# Patient Record
Sex: Female | Born: 1950 | Race: Black or African American | Hispanic: No | Marital: Married | Smoking: Never smoker
Health system: Southern US, Community
[De-identification: ages and names within clinical notes are randomized; demographics above are authoritative.]

## PROBLEM LIST (undated history)

## (undated) DIAGNOSIS — D563 Thalassemia minor: Secondary | ICD-10-CM

## (undated) DIAGNOSIS — I10 Essential (primary) hypertension: Secondary | ICD-10-CM

## (undated) HISTORY — PX: KNEE ARTHROSCOPY: SUR90

## (undated) HISTORY — PX: OOPHORECTOMY: SHX86

## (undated) HISTORY — DX: Thalassemia minor: D56.3

## (undated) HISTORY — DX: Essential (primary) hypertension: I10

---

## 1981-08-12 HISTORY — PX: GYNECOLOGIC CRYOSURGERY: SHX857

## 1988-08-12 HISTORY — PX: ABDOMINAL HYSTERECTOMY: SHX81

## 1997-10-25 ENCOUNTER — Ambulatory Visit (HOSPITAL_COMMUNITY): Admission: RE | Admit: 1997-10-25 | Discharge: 1997-10-25 | Payer: Self-pay | Admitting: Obstetrics and Gynecology

## 1997-12-01 ENCOUNTER — Other Ambulatory Visit: Admission: RE | Admit: 1997-12-01 | Discharge: 1997-12-01 | Payer: Self-pay | Admitting: Obstetrics and Gynecology

## 1998-12-29 ENCOUNTER — Ambulatory Visit (HOSPITAL_COMMUNITY): Admission: RE | Admit: 1998-12-29 | Discharge: 1998-12-29 | Payer: Self-pay | Admitting: Obstetrics and Gynecology

## 1998-12-29 ENCOUNTER — Encounter: Payer: Self-pay | Admitting: Obstetrics and Gynecology

## 2000-03-31 ENCOUNTER — Encounter: Payer: Self-pay | Admitting: Obstetrics and Gynecology

## 2000-03-31 ENCOUNTER — Ambulatory Visit (HOSPITAL_COMMUNITY): Admission: RE | Admit: 2000-03-31 | Discharge: 2000-03-31 | Payer: Self-pay | Admitting: Obstetrics and Gynecology

## 2001-10-13 ENCOUNTER — Encounter: Payer: Self-pay | Admitting: Obstetrics and Gynecology

## 2001-10-13 ENCOUNTER — Ambulatory Visit (HOSPITAL_COMMUNITY): Admission: RE | Admit: 2001-10-13 | Discharge: 2001-10-13 | Payer: Self-pay | Admitting: Obstetrics and Gynecology

## 2003-05-18 ENCOUNTER — Ambulatory Visit (HOSPITAL_COMMUNITY): Admission: RE | Admit: 2003-05-18 | Discharge: 2003-05-18 | Payer: Self-pay | Admitting: Obstetrics and Gynecology

## 2003-05-18 ENCOUNTER — Encounter: Payer: Self-pay | Admitting: Obstetrics and Gynecology

## 2004-08-29 ENCOUNTER — Ambulatory Visit (HOSPITAL_COMMUNITY): Admission: RE | Admit: 2004-08-29 | Discharge: 2004-08-29 | Payer: Self-pay | Admitting: Obstetrics and Gynecology

## 2004-12-17 ENCOUNTER — Encounter: Admission: RE | Admit: 2004-12-17 | Discharge: 2004-12-17 | Payer: Self-pay | Admitting: Orthopedic Surgery

## 2005-09-12 ENCOUNTER — Ambulatory Visit (HOSPITAL_BASED_OUTPATIENT_CLINIC_OR_DEPARTMENT_OTHER): Admission: RE | Admit: 2005-09-12 | Discharge: 2005-09-12 | Payer: Self-pay | Admitting: Orthopedic Surgery

## 2005-09-12 ENCOUNTER — Encounter (INDEPENDENT_AMBULATORY_CARE_PROVIDER_SITE_OTHER): Payer: Self-pay | Admitting: *Deleted

## 2005-10-24 ENCOUNTER — Ambulatory Visit (HOSPITAL_COMMUNITY): Admission: RE | Admit: 2005-10-24 | Discharge: 2005-10-24 | Payer: Self-pay | Admitting: Obstetrics and Gynecology

## 2005-10-29 ENCOUNTER — Other Ambulatory Visit: Admission: RE | Admit: 2005-10-29 | Discharge: 2005-10-29 | Payer: Self-pay | Admitting: Obstetrics and Gynecology

## 2006-11-14 ENCOUNTER — Ambulatory Visit (HOSPITAL_COMMUNITY): Admission: RE | Admit: 2006-11-14 | Discharge: 2006-11-14 | Payer: Self-pay | Admitting: Obstetrics and Gynecology

## 2006-11-18 ENCOUNTER — Encounter: Admission: RE | Admit: 2006-11-18 | Discharge: 2006-11-18 | Payer: Self-pay | Admitting: Internal Medicine

## 2006-11-24 ENCOUNTER — Encounter: Admission: RE | Admit: 2006-11-24 | Discharge: 2006-11-24 | Payer: Self-pay | Admitting: Internal Medicine

## 2007-01-07 ENCOUNTER — Other Ambulatory Visit: Admission: RE | Admit: 2007-01-07 | Discharge: 2007-01-07 | Payer: Self-pay | Admitting: Obstetrics and Gynecology

## 2007-07-22 ENCOUNTER — Encounter: Admission: RE | Admit: 2007-07-22 | Discharge: 2007-07-22 | Payer: Self-pay | Admitting: Internal Medicine

## 2007-12-03 ENCOUNTER — Ambulatory Visit (HOSPITAL_COMMUNITY): Admission: RE | Admit: 2007-12-03 | Discharge: 2007-12-03 | Payer: Self-pay | Admitting: Obstetrics and Gynecology

## 2008-01-11 ENCOUNTER — Other Ambulatory Visit: Admission: RE | Admit: 2008-01-11 | Discharge: 2008-01-11 | Payer: Self-pay | Admitting: Obstetrics and Gynecology

## 2008-09-05 ENCOUNTER — Ambulatory Visit: Payer: Self-pay | Admitting: Obstetrics and Gynecology

## 2008-09-08 ENCOUNTER — Ambulatory Visit (HOSPITAL_COMMUNITY): Admission: RE | Admit: 2008-09-08 | Discharge: 2008-09-08 | Payer: Self-pay | Admitting: Obstetrics and Gynecology

## 2009-01-06 ENCOUNTER — Ambulatory Visit (HOSPITAL_COMMUNITY): Admission: RE | Admit: 2009-01-06 | Discharge: 2009-01-06 | Payer: Self-pay | Admitting: Obstetrics and Gynecology

## 2009-02-21 ENCOUNTER — Other Ambulatory Visit: Admission: RE | Admit: 2009-02-21 | Discharge: 2009-02-21 | Payer: Self-pay | Admitting: Obstetrics and Gynecology

## 2009-02-21 ENCOUNTER — Encounter: Payer: Self-pay | Admitting: Obstetrics and Gynecology

## 2009-02-21 ENCOUNTER — Ambulatory Visit: Payer: Self-pay | Admitting: Obstetrics and Gynecology

## 2009-05-23 ENCOUNTER — Encounter: Admission: RE | Admit: 2009-05-23 | Discharge: 2009-05-23 | Payer: Self-pay | Admitting: Internal Medicine

## 2010-01-12 ENCOUNTER — Ambulatory Visit (HOSPITAL_COMMUNITY): Admission: RE | Admit: 2010-01-12 | Discharge: 2010-01-12 | Payer: Self-pay | Admitting: Obstetrics and Gynecology

## 2010-05-30 ENCOUNTER — Other Ambulatory Visit: Admission: RE | Admit: 2010-05-30 | Discharge: 2010-05-30 | Payer: Self-pay | Admitting: Obstetrics and Gynecology

## 2010-05-30 ENCOUNTER — Ambulatory Visit: Payer: Self-pay | Admitting: Obstetrics and Gynecology

## 2010-06-20 ENCOUNTER — Ambulatory Visit (HOSPITAL_COMMUNITY): Admission: RE | Admit: 2010-06-20 | Discharge: 2010-06-20 | Payer: Self-pay | Admitting: Obstetrics and Gynecology

## 2010-12-28 NOTE — Op Note (Signed)
NAMECORINTHIA, HELMERS NO.:  [id number]   MEDICAL RECORD NO.:  [id number]          PATIENT TYPE:  AMB   LOCATION:  DSC                          FACILITY:  [facility]   PHYSICIAN:  Nadara Mustard, MD     DATE OF BIRTH:  [birth year]   DATE OF PROCEDURE:  09/12/2005  DATE OF DISCHARGE:                                 OPERATIVE REPORT   PREOPERATIVE DIAGNOSES:  1.  Internal derangement left knee.  2.  Lipoma left knee.   POSTOPERATIVE DIAGNOSES:  1.  Degenerative medial lateral meniscal tears left knee.  2.  Lipoma left knee.   PROCEDURES:  1.  Partial medial and lateral meniscectomies left knee.  2.  Debridement osteochondral defect left knee.  3.  Excisional biopsy of lipoma left knee.   SURGEON:  Nadara Mustard, M.D.   ANESTHESIA:  General.   ESTIMATED BLOOD LOSS:  Minimal.   ANTIBIOTICS:  1 g of Kefzol.   Lipoma sent to pathology.   DISPOSITION:  To PACU in stable condition.   INDICATIONS FOR PROCEDURE:  The patient is 60 year old woman with mechanical  symptoms of her left knee. The patient also complains of a chronic right  lipoma-type mass which has not changed in size, texture, color, shape for a  prolonged period of time. However, she wishes to have this excised at this  time. Risks and benefits of surgery were discussed, including infection,  neurovascular injury, persistent knee pain, recurrence of the lipoma,  potential for the lipoma to be malignant. The patient states she understands  and wished to proceed at this time.   DESCRIPTION OF PROCEDURE:  The patient was brought to O.R. room 5 and  underwent a general anesthetic. After adequate level of anesthesia was  obtained, the patient's left lower extremity was prepped using DuraPrep and  draped into a sterile field. Attention was first focused on the  interarticular pathology. A scope was inserted through the inferior lateral  portal, and a working portal was established inferomedially.  Visualization  showed a significant amount of synovitis. This was debrided with a shaver,  and the vapor Mitek was used for hemostasis. Her ACL was intact. Examination  of the medial joint line with a valgus stress showed a degenerative tearing  of the medial meniscus. This was debrided. She also had a large  osteochondral defect of the medial femoral condyle. This was debrided with  both the shaver and the curette. This was debrided back to bleeding viable  subchondral bone. Examination of the lateral joint line in figure-of-four  position also showed degenerative changes of the lateral meniscus.  This was  also debrided with a shaver. Articular cartilage was intact on the lateral  joint line. Examination of the patellofemoral joint also showed intact  cartilage. A survey was then again performed of all three compartments.  There were no loose bodies. Medial and lateral gutters showed no loose  bodies. The instruments were removed. The portals were then closed with 4-0  nylon. Attention was then focused on with lipoma which is inferolateral to  the patellar tendon. A  small transverse incision was made. Blunt dissection  was carried down to the lipomatous mass, and this was resected in one block  of tissue. There were some adhesions to the patellar tendon of the lipoma-  type mass. The wound was irrigated with normal saline, and the skin was  closed using a modified vertical mattress suture with the 4-0 nylon. The  wounds were covered with Adaptic orthopedic sponges, sterile Webril, and a  Coban dressing. The incision was injected with total of 10 cc of 0.5%  Marcaine plain. The interarticular work was completed and closed prior to  the excision of the lipoma. The patient was then extubated and taken to the  PACU in stable condition. Plan to follow up in 2 weeks with the lipoma sent  to pathology for identification.      Nadara Mustard, MD  Electronically Signed     MVD/MEDQ  D:   09/12/2005  T:  09/12/2005  Job:  [id number]

## 2011-02-01 ENCOUNTER — Other Ambulatory Visit: Payer: Self-pay | Admitting: Obstetrics and Gynecology

## 2011-02-01 DIAGNOSIS — Z1231 Encounter for screening mammogram for malignant neoplasm of breast: Secondary | ICD-10-CM

## 2011-02-11 ENCOUNTER — Ambulatory Visit (HOSPITAL_COMMUNITY)
Admission: RE | Admit: 2011-02-11 | Discharge: 2011-02-11 | Disposition: A | Discharge status: Home / Self Care | Payer: Federal, State, Local not specified - PPO | Source: Ambulatory Visit | Attending: Obstetrics and Gynecology | Admitting: Obstetrics and Gynecology

## 2011-02-11 DIAGNOSIS — Z1231 Encounter for screening mammogram for malignant neoplasm of breast: Secondary | ICD-10-CM | POA: Insufficient documentation

## 2011-06-25 ENCOUNTER — Telehealth: Payer: Self-pay | Admitting: *Deleted

## 2011-06-25 NOTE — Telephone Encounter (Signed)
Lm for patient to call.  She is due for Reclast and overdue for annual exam.

## 2011-07-09 NOTE — Telephone Encounter (Signed)
Lm for patient to call to schedule annual exam so we can get labs done to set up Reclast that is due as well.

## 2011-07-11 NOTE — Telephone Encounter (Signed)
Patient informed.  Scheduled annual.

## 2011-08-21 DIAGNOSIS — I10 Essential (primary) hypertension: Secondary | ICD-10-CM | POA: Insufficient documentation

## 2011-08-21 DIAGNOSIS — D563 Thalassemia minor: Secondary | ICD-10-CM | POA: Insufficient documentation

## 2011-08-28 ENCOUNTER — Encounter: Payer: Federal, State, Local not specified - PPO | Admitting: Obstetrics and Gynecology

## 2011-10-09 ENCOUNTER — Encounter: Payer: Self-pay | Admitting: Obstetrics and Gynecology

## 2011-10-09 ENCOUNTER — Ambulatory Visit (INDEPENDENT_AMBULATORY_CARE_PROVIDER_SITE_OTHER): Payer: Federal, State, Local not specified - PPO | Admitting: Obstetrics and Gynecology

## 2011-10-09 VITALS — BP 146/90 | Ht 66.0 in | Wt 168.0 lb

## 2011-10-09 DIAGNOSIS — M810 Age-related osteoporosis without current pathological fracture: Secondary | ICD-10-CM

## 2011-10-09 DIAGNOSIS — Z01419 Encounter for gynecological examination (general) (routine) without abnormal findings: Secondary | ICD-10-CM

## 2011-10-09 NOTE — Progress Notes (Signed)
The patient came to see me today for an annual gynecological exam. She is having no menopausal symptoms. She is having no pelvic pain. She is having no vaginal bleeding. She is up-to-date on mammograms. She has osteoporosis on bone density. She's had 2 years of Reclast. The last one was 2011. She takes calcium and vitamin D. She's had no fractures. She is going to do her lab through her PCP.  HEENT: Within normal limits. Christina Casey present. Neck: No masses. Supraclavicular lymph nodes: Not enlarged. Breasts: Examined in both sitting and lying position. Symmetrical without skin changes or masses. Abdomen: Soft no masses guarding or rebound. No hernias. Pelvic: External within normal limits. BUS within normal limits. Vaginal examination shows good estrogen effect, no cystocele enterocele or rectocele. Cervix and uterus absent. Adnexa within normal limits. Rectovaginal confirmatory. Extremities within normal limits.  Assessment: Osteoporosis  Plan: Bone density ordered. After we see the results we will see about a 3rd year of Reclast. I told the patient that we probably would give her a third-year.

## 2011-10-10 LAB — URINALYSIS W MICROSCOPIC + REFLEX CULTURE
Bilirubin Urine: NEGATIVE
Casts: NONE SEEN
Crystals: NONE SEEN
Glucose, UA: NEGATIVE mg/dL
Ketones, ur: NEGATIVE mg/dL
Leukocytes, UA: NEGATIVE
Protein, ur: NEGATIVE mg/dL
Specific Gravity, Urine: 1.02 (ref 1.005–1.030)
Urobilinogen, UA: 0.2 mg/dL (ref 0.0–1.0)
pH: 5 (ref 5.0–8.0)

## 2011-11-07 ENCOUNTER — Ambulatory Visit (INDEPENDENT_AMBULATORY_CARE_PROVIDER_SITE_OTHER): Payer: Federal, State, Local not specified - PPO

## 2011-11-07 DIAGNOSIS — M8580 Other specified disorders of bone density and structure, unspecified site: Secondary | ICD-10-CM

## 2011-11-07 DIAGNOSIS — M899 Disorder of bone, unspecified: Secondary | ICD-10-CM

## 2011-11-07 DIAGNOSIS — M949 Disorder of cartilage, unspecified: Secondary | ICD-10-CM

## 2011-11-07 DIAGNOSIS — M810 Age-related osteoporosis without current pathological fracture: Secondary | ICD-10-CM

## 2011-11-13 ENCOUNTER — Telehealth: Payer: Self-pay | Admitting: *Deleted

## 2011-11-13 DIAGNOSIS — M810 Age-related osteoporosis without current pathological fracture: Secondary | ICD-10-CM

## 2011-11-13 NOTE — Telephone Encounter (Signed)
LM for pt to call back . Needs labwork for RECLAST.

## 2011-11-15 NOTE — Telephone Encounter (Signed)
inofrmed pt of Reclast benefits and the need for Calcium and Creatnine. She wants to proceed and will come Monday for Bloodwork.

## 2011-11-18 ENCOUNTER — Other Ambulatory Visit: Payer: Federal, State, Local not specified - PPO

## 2011-11-18 DIAGNOSIS — M810 Age-related osteoporosis without current pathological fracture: Secondary | ICD-10-CM

## 2011-11-19 ENCOUNTER — Telehealth: Payer: Self-pay | Admitting: *Deleted

## 2011-11-19 NOTE — Telephone Encounter (Signed)
Patient informed appt set up on 11/27/11 @ 10am.  Instructions letter mailed.

## 2011-11-19 NOTE — Telephone Encounter (Signed)
Message copied by Mckinley Jewel, Nickolaos Brallier L on Tue Nov 19, 2011  2:12 PM ------      Message from: Trellis Paganini      Created: Thu Nov 07, 2011 10:19 AM       Tell patient that bone density shows a positive response to IV Reclast. This is her year to do it as she has osteopenia and does it every other year. This is her third time and tell her I think we can stop after this.

## 2011-11-25 ENCOUNTER — Other Ambulatory Visit (HOSPITAL_COMMUNITY): Payer: Self-pay | Admitting: *Deleted

## 2011-11-27 ENCOUNTER — Encounter (HOSPITAL_COMMUNITY)
Admission: RE | Admit: 2011-11-27 | Discharge: 2011-11-27 | Disposition: A | Discharge status: Home / Self Care | Payer: Federal, State, Local not specified - PPO | Source: Ambulatory Visit | Attending: Obstetrics and Gynecology | Admitting: Obstetrics and Gynecology

## 2011-11-27 DIAGNOSIS — M810 Age-related osteoporosis without current pathological fracture: Secondary | ICD-10-CM | POA: Insufficient documentation

## 2011-11-27 MED ORDER — ZOLEDRONIC ACID 5 MG/100ML IV SOLN
5.0000 mg | Freq: Once | INTRAVENOUS | Status: AC
  Administered 2011-11-27: 5 mg via INTRAVENOUS
  Filled 2011-11-27: qty 100

## 2012-05-06 ENCOUNTER — Other Ambulatory Visit: Payer: Self-pay | Admitting: Obstetrics and Gynecology

## 2012-05-06 DIAGNOSIS — Z1231 Encounter for screening mammogram for malignant neoplasm of breast: Secondary | ICD-10-CM

## 2012-05-20 ENCOUNTER — Ambulatory Visit (HOSPITAL_COMMUNITY)
Admission: RE | Admit: 2012-05-20 | Discharge: 2012-05-20 | Disposition: A | Discharge status: Home / Self Care | Payer: Federal, State, Local not specified - PPO | Source: Ambulatory Visit | Attending: Obstetrics and Gynecology | Admitting: Obstetrics and Gynecology

## 2012-05-20 DIAGNOSIS — Z1231 Encounter for screening mammogram for malignant neoplasm of breast: Secondary | ICD-10-CM | POA: Insufficient documentation

## 2013-08-26 ENCOUNTER — Other Ambulatory Visit (HOSPITAL_COMMUNITY): Payer: Self-pay | Admitting: Internal Medicine

## 2013-08-26 DIAGNOSIS — Z1231 Encounter for screening mammogram for malignant neoplasm of breast: Secondary | ICD-10-CM

## 2013-09-03 ENCOUNTER — Ambulatory Visit (HOSPITAL_COMMUNITY)
Admission: RE | Admit: 2013-09-03 | Discharge: 2013-09-03 | Disposition: A | Discharge status: Home / Self Care | Payer: Federal, State, Local not specified - PPO | Source: Ambulatory Visit | Attending: Internal Medicine | Admitting: Internal Medicine

## 2013-09-03 DIAGNOSIS — Z1231 Encounter for screening mammogram for malignant neoplasm of breast: Secondary | ICD-10-CM

## 2013-09-23 ENCOUNTER — Encounter: Payer: Self-pay | Admitting: Gynecology

## 2013-10-20 ENCOUNTER — Encounter: Payer: Self-pay | Admitting: Gynecology

## 2013-10-20 ENCOUNTER — Ambulatory Visit (INDEPENDENT_AMBULATORY_CARE_PROVIDER_SITE_OTHER): Payer: Federal, State, Local not specified - PPO | Admitting: Gynecology

## 2013-10-20 ENCOUNTER — Other Ambulatory Visit (HOSPITAL_COMMUNITY)
Admission: RE | Admit: 2013-10-20 | Discharge: 2013-10-20 | Disposition: A | Discharge status: Home / Self Care | Payer: Federal, State, Local not specified - PPO | Source: Ambulatory Visit | Attending: Gynecology | Admitting: Gynecology

## 2013-10-20 VITALS — BP 124/80 | Ht 66.0 in | Wt 169.0 lb

## 2013-10-20 DIAGNOSIS — Z01419 Encounter for gynecological examination (general) (routine) without abnormal findings: Secondary | ICD-10-CM

## 2013-10-20 DIAGNOSIS — M899 Disorder of bone, unspecified: Secondary | ICD-10-CM

## 2013-10-20 DIAGNOSIS — M8580 Other specified disorders of bone density and structure, unspecified site: Secondary | ICD-10-CM

## 2013-10-20 DIAGNOSIS — N952 Postmenopausal atrophic vaginitis: Secondary | ICD-10-CM

## 2013-10-20 DIAGNOSIS — M949 Disorder of cartilage, unspecified: Secondary | ICD-10-CM

## 2013-10-20 NOTE — Addendum Note (Signed)
Addended by: Dayna BarkerGARDNER, Zeinab Rodwell K on: 10/20/2013 10:16 AM   Modules accepted: Orders

## 2013-10-20 NOTE — Patient Instructions (Signed)
Followup for bone density as scheduled. Followup in one year for annual exam 

## 2013-10-20 NOTE — Progress Notes (Signed)
Christina Casey 09-16-1950 [id number]        63 y.o.  G1P1001 for annual exam.  Former patient of Dr. Eda PaschalGottsegen with several issues noted below.  Past medical history,surgical history, problem list, medications, allergies, family history and social history were all reviewed and documented in the EPIC chart.  ROS:  Performed and pertinent positives and negatives are included in the history, assessment and plan .  Exam: Kim assistant Filed Vitals:   10/20/13 0936  BP: 124/80  Height: 5\' 6"  (1.676 m)  Weight: 169 lb (76.658 kg)   General appearance  Normal Skin grossly normal Head/Neck normal with no cervical or supraclavicular adenopathy thyroid normal Lungs  clear Cardiac RR, without RMG Abdominal  soft, nontender, without masses, organomegaly or hernia Breasts  examined lying and sitting without masses, retractions, discharge or axillary adenopathy. Pelvic  Ext/BUS/vagina with generalized mild atrophic changes  Adnexa  Without masses or tenderness    Anus and perineum  Normal   Rectovaginal  Normal sphincter tone without palpated masses or tenderness.    Assessment/Plan:  63 y.o. 191P1001 female for annual exam.   1. Postmenopausal status post TAH BSO for endometriosis 1990. Doing well without significant hot flushes, night sweats, vaginal dryness. Is not sexually active. Will continue to monitor. 2. Osteoporosis. DEXA 2008 with T score -3.1. Had been on Reclast x 2 years. Did not take last year. DEXA 10/2011 with T score -1.8. Unclear whether technical issues to account for a wide swing in her T score or actual improvement. Regardless will repeat DEXA now at 63-year-old interval and triaged based on results. Increase calcium and vitamin D recommendations reviewed. 3. Mammography 08/2013. Continue with annual mammography. SBE monthly reviewed. 4. Pap smear 2011. Pap smear done today. Current recommendations reviewed. She is status post hysterectomy for benign indications. Does have a  history of moderate dysplasia with cryosurgery in 1983. 5. Colonoscopy 8 years ago with planned repeat at 10 year interval. 6. Health maintenance. No blood work done this is all done through her primary physician's office. Followup one year, sooner as needed.   Note: This document was prepared with digital dictation and possible smart phrase technology. Any transcriptional errors that result from this process are unintentional.   Dara LordsFONTAINE,Philemon Riedesel P MD, 10:09 AM 10/20/2013

## 2013-10-21 LAB — URINALYSIS W MICROSCOPIC + REFLEX CULTURE
BILIRUBIN URINE: NEGATIVE
Bacteria, UA: NONE SEEN
Casts: NONE SEEN
Crystals: NONE SEEN
GLUCOSE, UA: NEGATIVE mg/dL
Hgb urine dipstick: NEGATIVE
KETONES UR: NEGATIVE mg/dL
Leukocytes, UA: NEGATIVE
Nitrite: NEGATIVE
PH: 5 (ref 5.0–8.0)
PROTEIN: NEGATIVE mg/dL
SPECIFIC GRAVITY, URINE: 1.022 (ref 1.005–1.030)
Squamous Epithelial / LPF: NONE SEEN
UROBILINOGEN UA: 0.2 mg/dL (ref 0.0–1.0)

## 2014-06-13 ENCOUNTER — Encounter: Payer: Self-pay | Admitting: Gynecology

## 2015-02-24 ENCOUNTER — Other Ambulatory Visit (HOSPITAL_COMMUNITY): Payer: Self-pay | Admitting: Internal Medicine

## 2015-02-24 DIAGNOSIS — Z1231 Encounter for screening mammogram for malignant neoplasm of breast: Secondary | ICD-10-CM

## 2015-03-06 ENCOUNTER — Ambulatory Visit (HOSPITAL_COMMUNITY)
Admission: RE | Admit: 2015-03-06 | Discharge: 2015-03-06 | Disposition: A | Discharge status: Home / Self Care | Payer: Federal, State, Local not specified - PPO | Source: Ambulatory Visit | Attending: Internal Medicine | Admitting: Internal Medicine

## 2015-03-06 DIAGNOSIS — Z1231 Encounter for screening mammogram for malignant neoplasm of breast: Secondary | ICD-10-CM

## 2016-02-19 ENCOUNTER — Other Ambulatory Visit: Payer: Self-pay | Admitting: Internal Medicine

## 2016-02-19 DIAGNOSIS — Z1231 Encounter for screening mammogram for malignant neoplasm of breast: Secondary | ICD-10-CM

## 2016-03-07 ENCOUNTER — Ambulatory Visit: Payer: Federal, State, Local not specified - PPO

## 2016-03-26 ENCOUNTER — Ambulatory Visit
Admission: RE | Admit: 2016-03-26 | Discharge: 2016-03-26 | Disposition: A | Discharge status: Home / Self Care | Payer: Federal, State, Local not specified - PPO | Source: Ambulatory Visit | Attending: Internal Medicine | Admitting: Internal Medicine

## 2016-03-26 DIAGNOSIS — Z1231 Encounter for screening mammogram for malignant neoplasm of breast: Secondary | ICD-10-CM

## 2016-05-21 DIAGNOSIS — H2511 Age-related nuclear cataract, right eye: Secondary | ICD-10-CM | POA: Diagnosis not present

## 2016-06-04 ENCOUNTER — Other Ambulatory Visit: Payer: Self-pay | Admitting: Internal Medicine

## 2016-06-04 DIAGNOSIS — Z0000 Encounter for general adult medical examination without abnormal findings: Secondary | ICD-10-CM | POA: Diagnosis not present

## 2016-06-04 DIAGNOSIS — M8580 Other specified disorders of bone density and structure, unspecified site: Secondary | ICD-10-CM | POA: Diagnosis not present

## 2016-06-04 DIAGNOSIS — Z1389 Encounter for screening for other disorder: Secondary | ICD-10-CM | POA: Diagnosis not present

## 2016-06-04 DIAGNOSIS — I10 Essential (primary) hypertension: Secondary | ICD-10-CM | POA: Diagnosis not present

## 2016-06-04 DIAGNOSIS — E041 Nontoxic single thyroid nodule: Secondary | ICD-10-CM

## 2016-06-04 DIAGNOSIS — Z1159 Encounter for screening for other viral diseases: Secondary | ICD-10-CM | POA: Diagnosis not present

## 2016-06-04 DIAGNOSIS — R7303 Prediabetes: Secondary | ICD-10-CM | POA: Diagnosis not present

## 2016-06-04 DIAGNOSIS — D509 Iron deficiency anemia, unspecified: Secondary | ICD-10-CM | POA: Diagnosis not present

## 2016-06-04 DIAGNOSIS — R9431 Abnormal electrocardiogram [ECG] [EKG]: Secondary | ICD-10-CM | POA: Diagnosis not present

## 2016-06-04 DIAGNOSIS — Z23 Encounter for immunization: Secondary | ICD-10-CM | POA: Diagnosis not present

## 2016-06-10 ENCOUNTER — Ambulatory Visit
Admission: RE | Admit: 2016-06-10 | Discharge: 2016-06-10 | Disposition: A | Discharge status: Home / Self Care | Payer: Medicare Other | Source: Ambulatory Visit | Attending: Internal Medicine | Admitting: Internal Medicine

## 2016-06-10 DIAGNOSIS — E042 Nontoxic multinodular goiter: Secondary | ICD-10-CM | POA: Diagnosis not present

## 2016-06-10 DIAGNOSIS — E041 Nontoxic single thyroid nodule: Secondary | ICD-10-CM

## 2016-06-11 NOTE — Progress Notes (Signed)
Electrophysiology Office Note   Date:  06/12/2016   ID:  Christina Casey, DOB Jul 16, 1951, MRN [id number]  PCP:  Wenda Low, MD  Cardiologist:  Constance Haw, MD    Chief Complaint  Patient presents with  . New Patient (Initial Visit)    Abnormal EKG     History of Present Illness: Christina Casey is a 65 y.o. female who presents today for electrophysiology evaluation.   She has a history of hypertension and prediabetes referred for an abnormal EKG. Her EKG shows a first-degree AV block with a PR interval of 220 ms, as well as nonspecific ST and T-wave changes. Currently she says that she feels well. She has no major complaints. She says that she does not have shortness of breath, chest pain, PND, or orthopnea. She is able to walk on flat ground without limitations, and climb stairs without having to stop to catch her breath or without chest pain.   Today, she denies symptoms of palpitations, chest pain, shortness of breath, orthopnea, PND, lower extremity edema, claudication, dizziness, presyncope, syncope, bleeding, or neurologic sequela. The patient is tolerating medications without difficulties and is otherwise without complaint today.    Past Medical History:  Diagnosis Date  . Beta thalassemia trait   . Hypertension   . Osteoporosis 10/2011   T score -1.8 prior DEXA 2008 with T score of -3.1   Past Surgical History:  Procedure Laterality Date  . ABDOMINAL HYSTERECTOMY  1990   TAH,BSO for endometriosis  . GYNECOLOGIC CRYOSURGERY  1983   Moderate dysplasia  . KNEE ARTHROSCOPY    . OOPHORECTOMY     BSO     Current Outpatient Prescriptions  Medication Sig Dispense Refill  . Calcium Carbonate (CALTRATE 600 PO) Take 1 capsule by mouth daily.     . Cholecalciferol (VITAMIN D PO) Take 2,000 Units by mouth.    . IRON PO Take 1 tablet by mouth daily.     . nebivolol (BYSTOLIC) 10 MG tablet Take 10 mg by mouth daily.    . Olmesartan Medoxomil (BENICAR PO) Take 1  tablet by mouth daily.      No current facility-administered medications for this visit.     Allergies:   Review of patient's allergies indicates no known allergies.   Social History:  The patient  reports that she has never smoked. She does not have any smokeless tobacco history on file. She reports that she does not drink alcohol or use drugs.   Family History:  The patient's family history includes CAD in her father and sister; Healthy in her brother, brother, sister, sister, and sister; Heart disease in her father; Hypertension in her brother, father, sister, sister, and sister; Multiple myeloma in her sister; Osteoporosis in her sister and sister.    ROS:  Please see the history of present illness.   Otherwise, review of systems is positive for none.   All other systems are reviewed and negative.    PHYSICAL EXAM: VS:  BP (!) 180/100   Pulse (!) 59   Ht 5' 5.5" (1.664 m)   Wt 183 lb 9.6 oz (83.3 kg)   BMI 30.09 kg/m  , BMI Body mass index is 30.09 kg/m. GEN: Well nourished, well developed, in no acute distress  HEENT: normal  Neck: no JVD, carotid bruits, or masses Cardiac: RRR; no murmurs, rubs, or gallops,no edema  Respiratory:  clear to auscultation bilaterally, normal work of breathing GI: soft, nontender, nondistended, + BS MS: no deformity  or atrophy  Skin: warm and dry Neuro:  Strength and sensation are intact Psych: euthymic mood, full affect  EKG:  EKG is ordered today. Personal review of the ekg ordered shows sinus rhythm, rate 59, nonspecific St changes  Recent Labs: No results found for requested labs within last 8760 hours.    Lipid Panel  No results found for: CHOL, TRIG, HDL, CHOLHDL, VLDL, LDLCALC, LDLDIRECT   Wt Readings from Last 3 Encounters:  06/12/16 183 lb 9.6 oz (83.3 kg)  10/20/13 169 lb (76.7 kg)  11/27/11 170 lb (77.1 kg)      Other studies Reviewed: Additional studies/ records that were reviewed today include: PCP  notes   ASSESSMENT AND PLAN:  1.  Hypertension: Elevated today in clinic but that she did not take her blood pressure medications. I told her to go home and take her blood pressure medicines and to recheck her blood pressure on Friday. If her blood pressure is not improved by then, I have told her to call her primary physician for titration of medications.  2. Abnormal ECG: EKG that was sent over by her primary physician at a first-degree AV block and nonspecific ST changes. EKG today in clinic shows no evidence of first-degree AV block with PR interval of 192 ms as well as nonspecific ST changes. She does not have any symptoms of chest pain, or shortness of breath to make me think that she has any coronary disease or heart failure. At this time, no further workup is necessary. I told her to call us back if she does develop further symptoms.    Current medicines are reviewed at length with the patient today.   The patient does not have concerns regarding her medicines.  The following changes were made today:  none  Labs/ tests ordered today include:  Orders Placed This Encounter  Procedures  . EKG 12-Lead     Disposition:   FU with Kaelah Hayashi PRN  Signed, Laquon Emel Meredith Leeds, MD  06/12/2016 9:23 AM     [address] [place] [address] [phone] (office) [phone] (fax)

## 2016-06-12 ENCOUNTER — Encounter: Payer: Self-pay | Admitting: Cardiology

## 2016-06-12 ENCOUNTER — Ambulatory Visit (INDEPENDENT_AMBULATORY_CARE_PROVIDER_SITE_OTHER): Payer: Medicare Other | Admitting: Cardiology

## 2016-06-12 VITALS — BP 180/100 | HR 59 | Ht 65.5 in | Wt 183.6 lb

## 2016-06-12 DIAGNOSIS — I10 Essential (primary) hypertension: Secondary | ICD-10-CM | POA: Diagnosis not present

## 2016-06-17 ENCOUNTER — Encounter: Payer: Self-pay | Admitting: Cardiology

## 2016-06-17 DIAGNOSIS — H2512 Age-related nuclear cataract, left eye: Secondary | ICD-10-CM | POA: Diagnosis not present

## 2016-06-17 DIAGNOSIS — H25812 Combined forms of age-related cataract, left eye: Secondary | ICD-10-CM | POA: Diagnosis not present

## 2016-07-01 DIAGNOSIS — R740 Nonspecific elevation of levels of transaminase and lactic acid dehydrogenase [LDH]: Secondary | ICD-10-CM | POA: Diagnosis not present

## 2016-07-01 DIAGNOSIS — H25011 Cortical age-related cataract, right eye: Secondary | ICD-10-CM | POA: Diagnosis not present

## 2016-07-01 DIAGNOSIS — H25811 Combined forms of age-related cataract, right eye: Secondary | ICD-10-CM | POA: Diagnosis not present

## 2016-07-01 DIAGNOSIS — H25041 Posterior subcapsular polar age-related cataract, right eye: Secondary | ICD-10-CM | POA: Diagnosis not present

## 2016-07-01 DIAGNOSIS — H2511 Age-related nuclear cataract, right eye: Secondary | ICD-10-CM | POA: Diagnosis not present

## 2016-12-04 DIAGNOSIS — D509 Iron deficiency anemia, unspecified: Secondary | ICD-10-CM | POA: Diagnosis not present

## 2016-12-04 DIAGNOSIS — R7309 Other abnormal glucose: Secondary | ICD-10-CM | POA: Diagnosis not present

## 2016-12-04 DIAGNOSIS — I10 Essential (primary) hypertension: Secondary | ICD-10-CM | POA: Diagnosis not present

## 2016-12-04 DIAGNOSIS — M8580 Other specified disorders of bone density and structure, unspecified site: Secondary | ICD-10-CM | POA: Diagnosis not present

## 2017-06-04 ENCOUNTER — Other Ambulatory Visit: Payer: Self-pay | Admitting: Internal Medicine

## 2017-06-04 DIAGNOSIS — Z139 Encounter for screening, unspecified: Secondary | ICD-10-CM

## 2017-06-23 ENCOUNTER — Ambulatory Visit: Payer: Medicare Other

## 2017-07-23 ENCOUNTER — Ambulatory Visit
Admission: RE | Admit: 2017-07-23 | Discharge: 2017-07-23 | Disposition: A | Discharge status: Home / Self Care | Payer: Medicare Other | Source: Ambulatory Visit | Attending: Internal Medicine | Admitting: Internal Medicine

## 2017-07-23 DIAGNOSIS — Z1231 Encounter for screening mammogram for malignant neoplasm of breast: Secondary | ICD-10-CM | POA: Diagnosis not present

## 2017-07-23 DIAGNOSIS — Z139 Encounter for screening, unspecified: Secondary | ICD-10-CM

## 2017-08-21 DIAGNOSIS — Z961 Presence of intraocular lens: Secondary | ICD-10-CM | POA: Diagnosis not present

## 2017-08-21 DIAGNOSIS — H40013 Open angle with borderline findings, low risk, bilateral: Secondary | ICD-10-CM | POA: Diagnosis not present

## 2018-01-15 DIAGNOSIS — Z1211 Encounter for screening for malignant neoplasm of colon: Secondary | ICD-10-CM | POA: Diagnosis not present

## 2018-01-15 DIAGNOSIS — Z0000 Encounter for general adult medical examination without abnormal findings: Secondary | ICD-10-CM | POA: Diagnosis not present

## 2018-01-15 DIAGNOSIS — M8580 Other specified disorders of bone density and structure, unspecified site: Secondary | ICD-10-CM | POA: Diagnosis not present

## 2018-01-15 DIAGNOSIS — Z23 Encounter for immunization: Secondary | ICD-10-CM | POA: Diagnosis not present

## 2018-01-15 DIAGNOSIS — D509 Iron deficiency anemia, unspecified: Secondary | ICD-10-CM | POA: Diagnosis not present

## 2018-01-15 DIAGNOSIS — I10 Essential (primary) hypertension: Secondary | ICD-10-CM | POA: Diagnosis not present

## 2018-01-15 DIAGNOSIS — Z1389 Encounter for screening for other disorder: Secondary | ICD-10-CM | POA: Diagnosis not present

## 2018-01-15 DIAGNOSIS — R7303 Prediabetes: Secondary | ICD-10-CM | POA: Diagnosis not present

## 2018-01-15 DIAGNOSIS — E7800 Pure hypercholesterolemia, unspecified: Secondary | ICD-10-CM | POA: Diagnosis not present

## 2018-02-26 DIAGNOSIS — Z1211 Encounter for screening for malignant neoplasm of colon: Secondary | ICD-10-CM | POA: Diagnosis not present

## 2018-02-26 DIAGNOSIS — K5730 Diverticulosis of large intestine without perforation or abscess without bleeding: Secondary | ICD-10-CM | POA: Diagnosis not present

## 2018-03-12 DIAGNOSIS — M8588 Other specified disorders of bone density and structure, other site: Secondary | ICD-10-CM | POA: Diagnosis not present

## 2018-07-16 ENCOUNTER — Other Ambulatory Visit: Payer: Self-pay | Admitting: Internal Medicine

## 2018-07-16 DIAGNOSIS — Z1231 Encounter for screening mammogram for malignant neoplasm of breast: Secondary | ICD-10-CM

## 2018-08-20 ENCOUNTER — Encounter (INDEPENDENT_AMBULATORY_CARE_PROVIDER_SITE_OTHER): Payer: Self-pay | Admitting: Orthopedic Surgery

## 2018-08-20 ENCOUNTER — Ambulatory Visit (INDEPENDENT_AMBULATORY_CARE_PROVIDER_SITE_OTHER): Payer: Medicare Other | Admitting: Physician Assistant

## 2018-08-20 ENCOUNTER — Ambulatory Visit (INDEPENDENT_AMBULATORY_CARE_PROVIDER_SITE_OTHER): Payer: Medicare Other

## 2018-08-20 VITALS — Ht 65.5 in | Wt 183.6 lb

## 2018-08-20 DIAGNOSIS — M25561 Pain in right knee: Secondary | ICD-10-CM | POA: Diagnosis not present

## 2018-08-20 DIAGNOSIS — M1711 Unilateral primary osteoarthritis, right knee: Secondary | ICD-10-CM | POA: Diagnosis not present

## 2018-08-20 NOTE — Progress Notes (Signed)
Office Visit Note   Patient: Christina Casey           Date of Birth: 07/31/1951           MRN: [id number] Visit Date: 08/20/2018              Requested by: Wenda Low, MD [address] [place], [place] [address] PCP: Wenda Low, MD  Chief Complaint  Patient presents with  . Right Knee - Pain      HPI: The patient is a 68 yo woman who has known osteoarthritis of bilateral knees who presents with right knee pain. She reports on 07/27/18 she may have twisted her right knee getting out of a car and had some immediate pain in the knee. She started taking some ibuprofen over the past couple of weeks and this has helped, but not fully relieved her pain. She reports increased pain with walking and going up and down stairs. She reports no locking, some cracking over the knee cap at times.   Assessment & Plan: Visit Diagnoses:  1. Unilateral primary osteoarthritis, right knee   2. Right knee pain, unspecified chronicity     Plan: After informed consent, the patient's right knee was injected with steroid under sterile technique and the patient tolerated this well. She will follow  Up in several weeks.   Follow-Up Instructions: Return in about 4 weeks (around 09/17/2018).   Ortho Exam  Patient is alert, oriented, no adenopathy, well-dressed, normal affect, normal respiratory effort. Right knee with small effusion, tenderness over the right medial joint line. Negative drawer. Range of motion 0-110 degrees  Imaging: Xr Knee 1-2 Views Right  Result Date: 08/20/2018 2 view of right knee showed moderate osteoarthritis of bilateral medial compartment, left side slightly worse than right with joint space narrowing , subchondral sclerosis and osteophytes  No images are attached to the encounter.  Labs: No results found for: HGBA1C, ESRSEDRATE, CRP, LABURIC, REPTSTATUS, GRAMSTAIN, CULT, LABORGA   No results found for: ALBUMIN, PREALBUMIN, LABURIC  Body mass index is  30.09 kg/m.  Orders:  Orders Placed This Encounter  Procedures  . Large Joint Inj: R knee  . XR Knee 1-2 Views Right   No orders of the defined types were placed in this encounter.    Procedures: Large Joint Inj: R knee on 08/20/2018 9:08 AM Indications: pain and diagnostic evaluation Details: 22 G 1.5 in needle, anteromedial approach  Arthrogram: No  Medications: 5 mL lidocaine 1 %; 40 mg methylPREDNISolone acetate 40 MG/ML Outcome: tolerated well, no immediate complications Procedure, treatment alternatives, risks and benefits explained, specific risks discussed. Consent was given by the patient. Immediately prior to procedure a time out was called to verify the correct patient, procedure, equipment, support staff and site/side marked as required. Patient was prepped and draped in the usual sterile fashion.      Clinical Data: No additional findings.  ROS:  All other systems negative, except as noted in the HPI. Review of Systems  Objective: Vital Signs: Ht 5' 5.5" (1.664 m)   Wt 183 lb 9.6 oz (83.3 kg)   BMI 30.09 kg/m   Specialty Comments:  No specialty comments available.  PMFS History: Patient Active Problem List   Diagnosis Date Noted  . Osteoporosis   . Beta thalassemia trait   . Hypertension    Past Medical History:  Diagnosis Date  . Beta thalassemia trait   . Hypertension   . Osteoporosis 10/2011   T score -  1.8 prior DEXA 2008 with T score of -3.1    Family History  Problem Relation Age of Onset  . Hypertension Father   . Heart disease Father   . CAD Father   . Hypertension Sister   . Osteoporosis Sister   . Multiple myeloma Sister   . Hypertension Sister   . Osteoporosis Sister   . CAD Sister   . Hypertension Sister   . Hypertension Brother   . Healthy Sister   . Healthy Sister   . Healthy Sister   . Healthy Brother   . Healthy Brother   . Breast cancer Neg Hx     Past Surgical History:  Procedure Laterality Date  . ABDOMINAL  HYSTERECTOMY  1990   TAH,BSO for endometriosis  . GYNECOLOGIC CRYOSURGERY  1983   Moderate dysplasia  . KNEE ARTHROSCOPY    . OOPHORECTOMY     BSO   Social History   Occupational History  . Not on file  Tobacco Use  . Smoking status: Never Smoker  . Smokeless tobacco: Never Used  Substance and Sexual Activity  . Alcohol use: No  . Drug use: No  . Sexual activity: Never    Birth control/protection: Surgical

## 2018-08-22 MED ORDER — LIDOCAINE HCL 1 % IJ SOLN
5.0000 mL | INTRAMUSCULAR | Status: AC | PRN
  Administered 2018-08-20: 5 mL

## 2018-08-22 MED ORDER — METHYLPREDNISOLONE ACETATE 40 MG/ML IJ SUSP
40.0000 mg | INTRAMUSCULAR | Status: AC | PRN
  Administered 2018-08-20: 40 mg via INTRA_ARTICULAR

## 2018-08-25 ENCOUNTER — Ambulatory Visit
Admission: RE | Admit: 2018-08-25 | Discharge: 2018-08-25 | Disposition: A | Discharge status: Home / Self Care | Payer: Medicare Other | Source: Ambulatory Visit | Attending: Internal Medicine | Admitting: Internal Medicine

## 2018-08-25 DIAGNOSIS — Z1231 Encounter for screening mammogram for malignant neoplasm of breast: Secondary | ICD-10-CM

## 2018-09-03 DIAGNOSIS — I10 Essential (primary) hypertension: Secondary | ICD-10-CM | POA: Diagnosis not present

## 2018-09-03 DIAGNOSIS — M179 Osteoarthritis of knee, unspecified: Secondary | ICD-10-CM | POA: Diagnosis not present

## 2018-09-03 DIAGNOSIS — D509 Iron deficiency anemia, unspecified: Secondary | ICD-10-CM | POA: Diagnosis not present

## 2018-09-03 DIAGNOSIS — R7303 Prediabetes: Secondary | ICD-10-CM | POA: Diagnosis not present

## 2018-09-03 DIAGNOSIS — M8580 Other specified disorders of bone density and structure, unspecified site: Secondary | ICD-10-CM | POA: Diagnosis not present

## 2019-04-15 DIAGNOSIS — J310 Chronic rhinitis: Secondary | ICD-10-CM | POA: Diagnosis not present

## 2019-04-15 DIAGNOSIS — Z23 Encounter for immunization: Secondary | ICD-10-CM | POA: Diagnosis not present

## 2019-04-15 DIAGNOSIS — M179 Osteoarthritis of knee, unspecified: Secondary | ICD-10-CM | POA: Diagnosis not present

## 2019-04-15 DIAGNOSIS — I10 Essential (primary) hypertension: Secondary | ICD-10-CM | POA: Diagnosis not present

## 2019-04-15 DIAGNOSIS — R7303 Prediabetes: Secondary | ICD-10-CM | POA: Diagnosis not present

## 2019-04-15 DIAGNOSIS — E7800 Pure hypercholesterolemia, unspecified: Secondary | ICD-10-CM | POA: Diagnosis not present

## 2019-04-15 DIAGNOSIS — Z1389 Encounter for screening for other disorder: Secondary | ICD-10-CM | POA: Diagnosis not present

## 2019-04-15 DIAGNOSIS — M8580 Other specified disorders of bone density and structure, unspecified site: Secondary | ICD-10-CM | POA: Diagnosis not present

## 2019-04-15 DIAGNOSIS — Z0000 Encounter for general adult medical examination without abnormal findings: Secondary | ICD-10-CM | POA: Diagnosis not present

## 2019-04-15 DIAGNOSIS — D509 Iron deficiency anemia, unspecified: Secondary | ICD-10-CM | POA: Diagnosis not present

## 2019-09-23 ENCOUNTER — Other Ambulatory Visit: Payer: Self-pay | Admitting: Internal Medicine

## 2019-09-23 DIAGNOSIS — Z1231 Encounter for screening mammogram for malignant neoplasm of breast: Secondary | ICD-10-CM

## 2019-10-28 ENCOUNTER — Ambulatory Visit
Admission: RE | Admit: 2019-10-28 | Discharge: 2019-10-28 | Disposition: A | Discharge status: Home / Self Care | Payer: Medicare Other | Source: Ambulatory Visit | Attending: Internal Medicine | Admitting: Internal Medicine

## 2019-10-28 ENCOUNTER — Other Ambulatory Visit: Payer: Self-pay

## 2019-10-28 DIAGNOSIS — Z1231 Encounter for screening mammogram for malignant neoplasm of breast: Secondary | ICD-10-CM

## 2019-11-11 DIAGNOSIS — R7303 Prediabetes: Secondary | ICD-10-CM | POA: Diagnosis not present

## 2019-11-11 DIAGNOSIS — I10 Essential (primary) hypertension: Secondary | ICD-10-CM | POA: Diagnosis not present

## 2019-12-16 IMAGING — MG DIGITAL SCREENING BILATERAL MAMMOGRAM WITH TOMO AND CAD
8 series · 8 of 24 positions shown · non-contrast
Comparison: Previous exam(s).

CLINICAL DATA: Screening.

EXAM:
DIGITAL SCREENING BILATERAL MAMMOGRAM WITH TOMO AND CAD

[R MLO synth-2D]
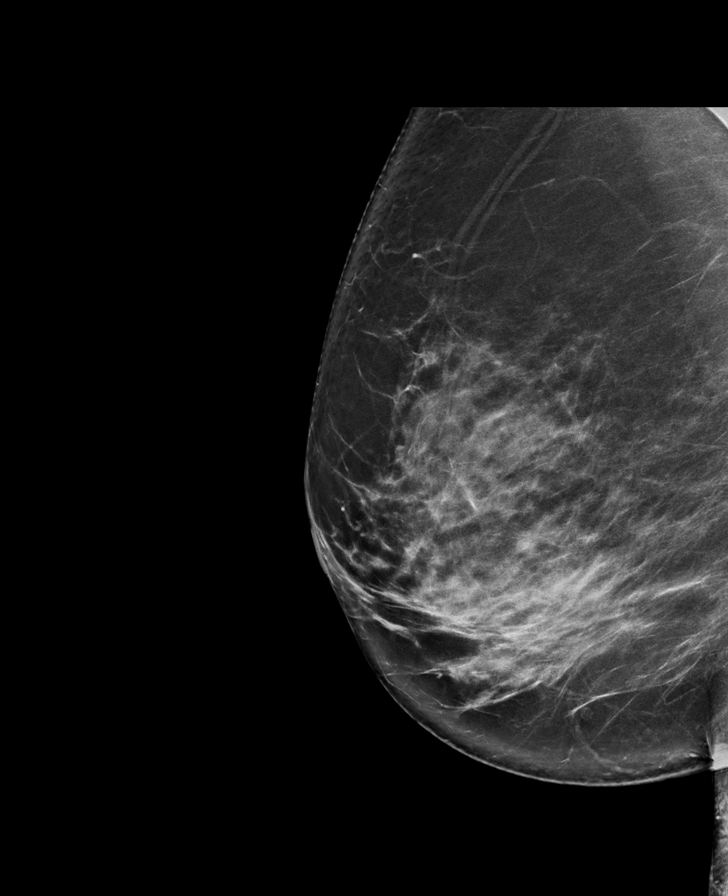

[L CC synth-2D]
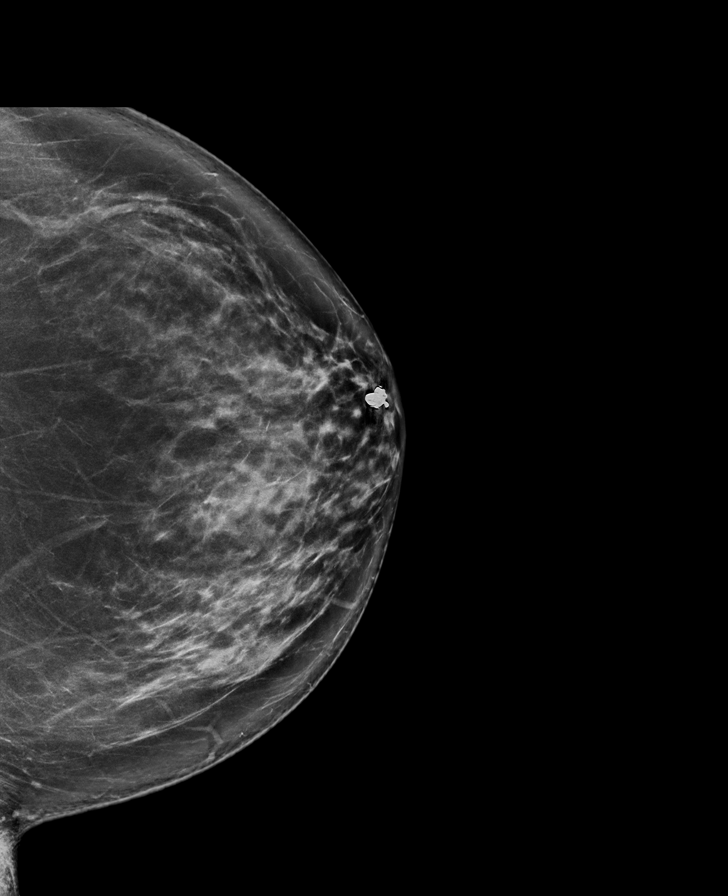

[L MLO synth-2D]
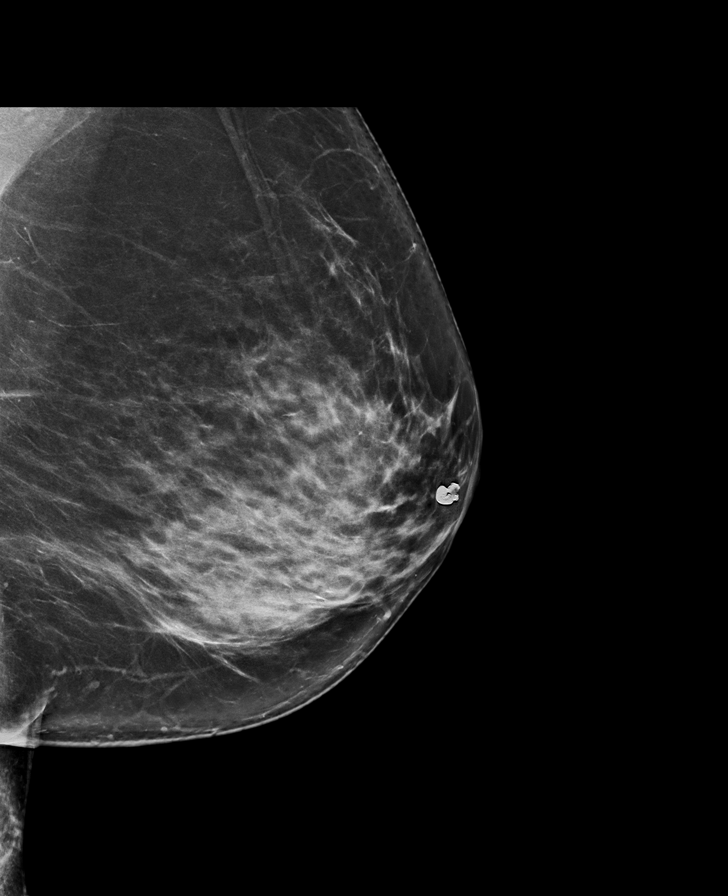

[R CC synth-2D]
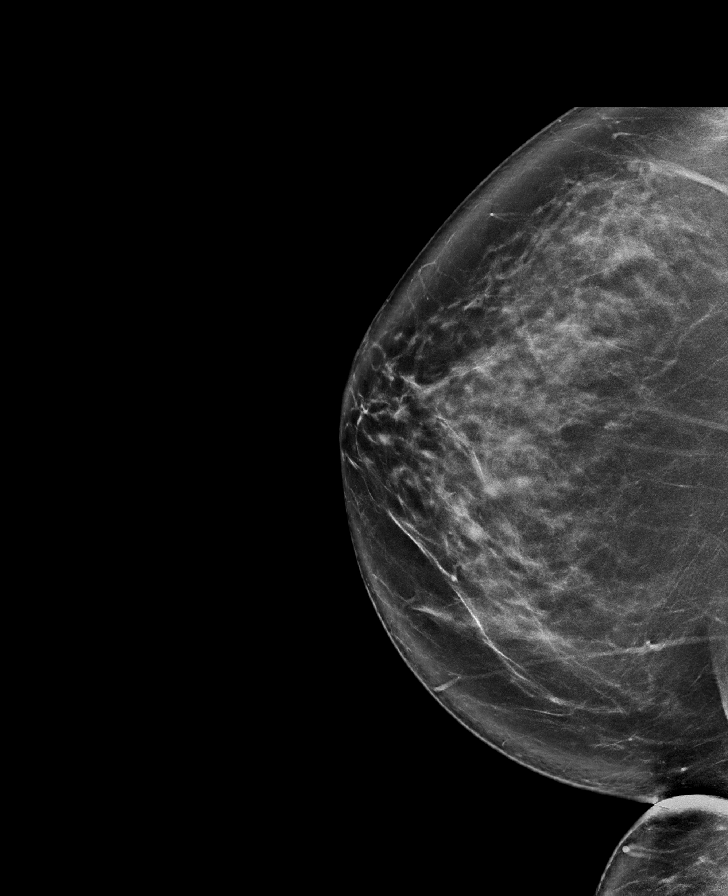

[L MLO tomo · tomo slice 45/90.0]
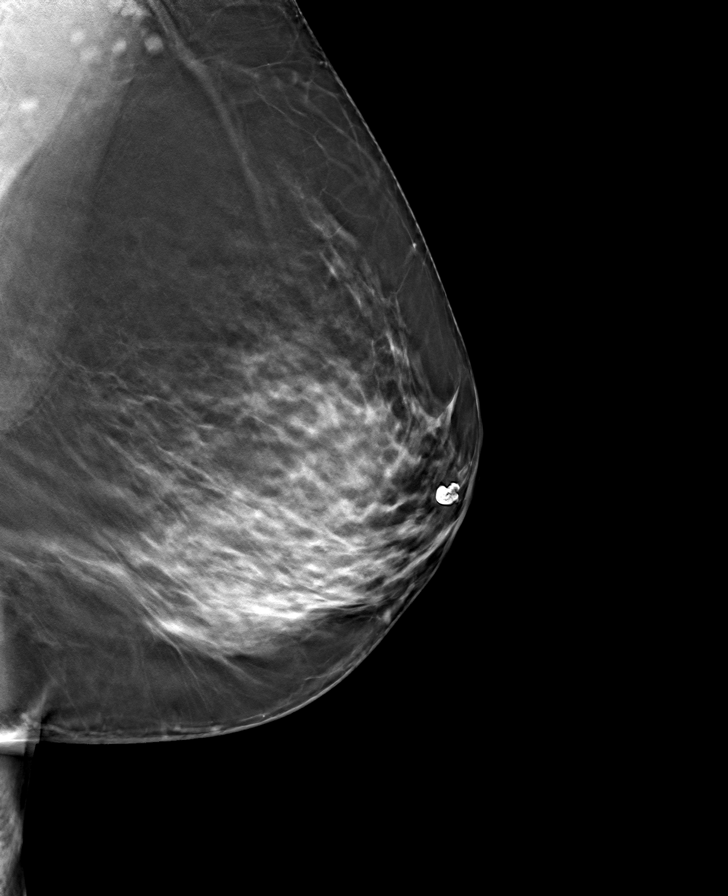

[L CC tomo · tomo slice 49/96.0]
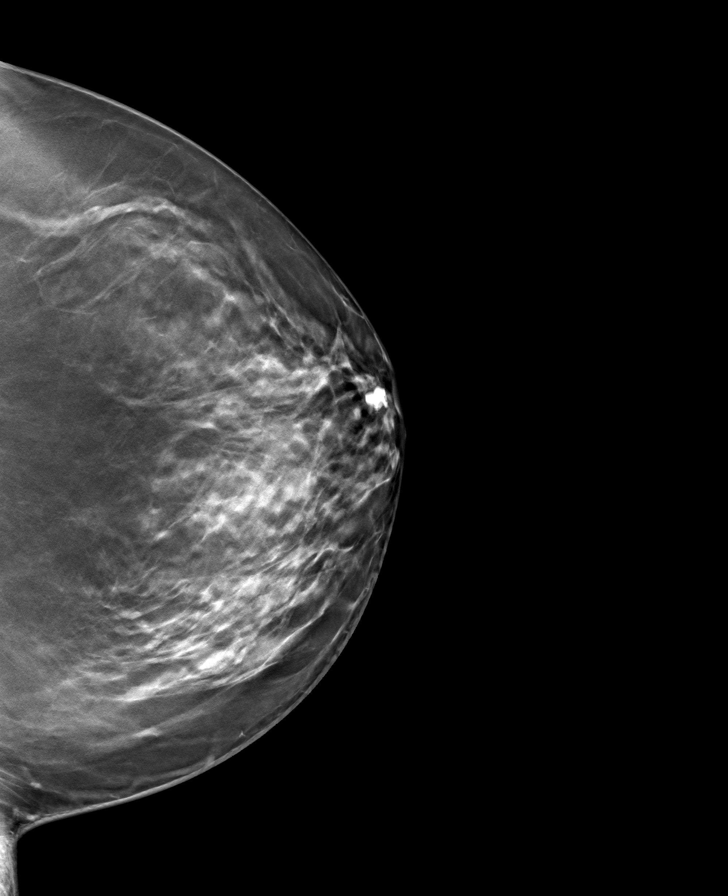

[R MLO tomo · tomo slice 50/99.0]
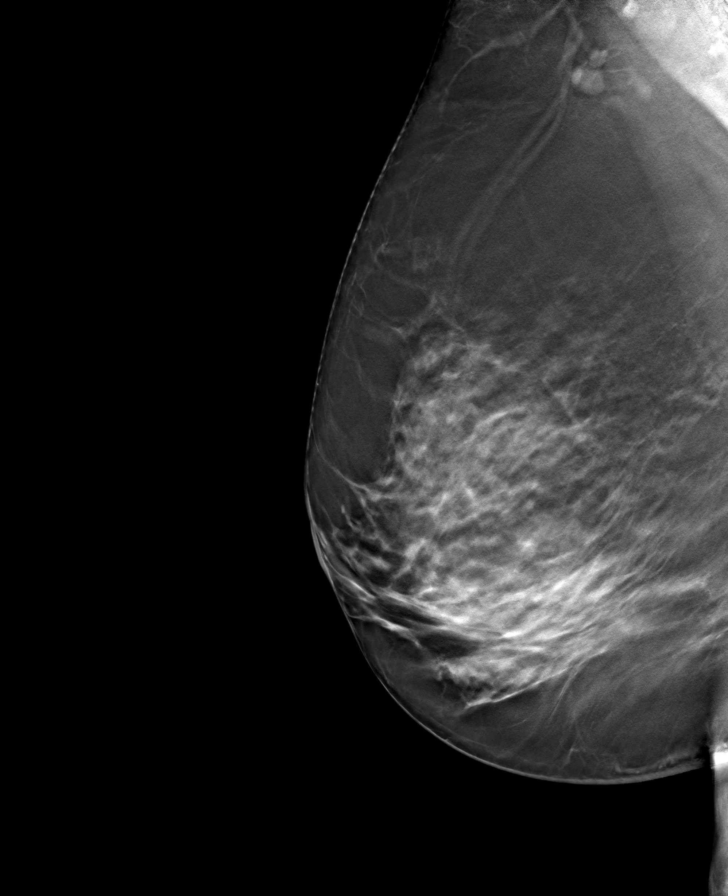

[R CC tomo · tomo slice 51/101.0]
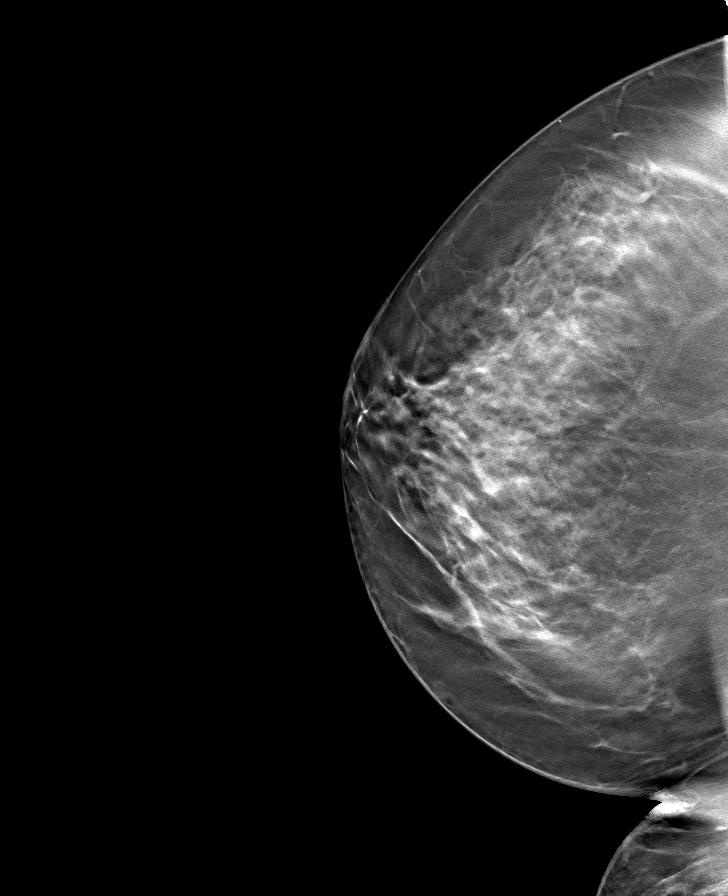

[8 of 24 positions shown; findings below may reference images not displayed]

ACR Breast Density Category c: The breast tissue is heterogeneously
dense, which may obscure small masses.
FINDINGS: There are no findings suspicious for malignancy. Images were
processed with CAD.
IMPRESSION: No mammographic evidence of malignancy. A result letter of this
screening mammogram will be mailed directly to the patient.

RECOMMENDATION:
Screening mammogram in one year. (Code:FT-U-LHB)

BI-RADS CATEGORY  1: Negative.

## 2020-07-20 ENCOUNTER — Ambulatory Visit: Payer: Medicare Other | Admitting: Orthopedic Surgery

## 2020-09-15 DIAGNOSIS — Z0000 Encounter for general adult medical examination without abnormal findings: Secondary | ICD-10-CM | POA: Diagnosis not present

## 2020-09-15 DIAGNOSIS — E559 Vitamin D deficiency, unspecified: Secondary | ICD-10-CM | POA: Diagnosis not present

## 2020-09-15 DIAGNOSIS — M8580 Other specified disorders of bone density and structure, unspecified site: Secondary | ICD-10-CM | POA: Diagnosis not present

## 2020-09-15 DIAGNOSIS — R7309 Other abnormal glucose: Secondary | ICD-10-CM | POA: Diagnosis not present

## 2020-09-15 DIAGNOSIS — I10 Essential (primary) hypertension: Secondary | ICD-10-CM | POA: Diagnosis not present

## 2020-09-15 DIAGNOSIS — E7800 Pure hypercholesterolemia, unspecified: Secondary | ICD-10-CM | POA: Diagnosis not present

## 2020-09-15 DIAGNOSIS — Z1389 Encounter for screening for other disorder: Secondary | ICD-10-CM | POA: Diagnosis not present

## 2020-09-22 ENCOUNTER — Other Ambulatory Visit: Payer: Self-pay | Admitting: Internal Medicine

## 2020-09-22 DIAGNOSIS — Z1231 Encounter for screening mammogram for malignant neoplasm of breast: Secondary | ICD-10-CM

## 2020-10-12 DIAGNOSIS — H43812 Vitreous degeneration, left eye: Secondary | ICD-10-CM | POA: Diagnosis not present

## 2020-10-12 DIAGNOSIS — H26492 Other secondary cataract, left eye: Secondary | ICD-10-CM | POA: Diagnosis not present

## 2020-10-12 DIAGNOSIS — H04123 Dry eye syndrome of bilateral lacrimal glands: Secondary | ICD-10-CM | POA: Diagnosis not present

## 2020-10-12 DIAGNOSIS — Z961 Presence of intraocular lens: Secondary | ICD-10-CM | POA: Diagnosis not present

## 2020-10-12 DIAGNOSIS — H40023 Open angle with borderline findings, high risk, bilateral: Secondary | ICD-10-CM | POA: Diagnosis not present

## 2020-11-16 ENCOUNTER — Ambulatory Visit: Payer: Medicare Other

## 2020-11-18 ENCOUNTER — Inpatient Hospital Stay: Admission: RE | Admit: 2020-11-18 | Payer: Medicare Other | Source: Ambulatory Visit

## 2021-01-05 ENCOUNTER — Ambulatory Visit: Payer: Self-pay

## 2021-01-05 ENCOUNTER — Encounter: Payer: Self-pay | Admitting: Physician Assistant

## 2021-01-05 ENCOUNTER — Ambulatory Visit (INDEPENDENT_AMBULATORY_CARE_PROVIDER_SITE_OTHER): Payer: Medicare Other | Admitting: Physician Assistant

## 2021-01-05 ENCOUNTER — Ambulatory Visit (INDEPENDENT_AMBULATORY_CARE_PROVIDER_SITE_OTHER): Payer: Medicare Other

## 2021-01-05 DIAGNOSIS — G8929 Other chronic pain: Secondary | ICD-10-CM

## 2021-01-05 DIAGNOSIS — M25562 Pain in left knee: Secondary | ICD-10-CM

## 2021-01-05 DIAGNOSIS — M25561 Pain in right knee: Secondary | ICD-10-CM

## 2021-01-05 MED ORDER — LIDOCAINE HCL 1 % IJ SOLN
5.0000 mL | INTRAMUSCULAR | Status: AC | PRN
  Administered 2021-01-05: 5 mL

## 2021-01-05 MED ORDER — METHYLPREDNISOLONE ACETATE 40 MG/ML IJ SUSP
40.0000 mg | INTRAMUSCULAR | Status: AC | PRN
  Administered 2021-01-05: 40 mg via INTRA_ARTICULAR

## 2021-01-05 NOTE — Progress Notes (Signed)
Office Visit Note   Patient: Christina Casey           Date of Birth: Apr 27, 1951           MRN: [id number] Visit Date: 01/05/2021              Requested by: Wenda Low, MD [address] [place],  [place] [address] PCP: Wenda Low, MD  Chief Complaint  Patient presents with  . Left Knee - Pain  . Right Knee - Pain      HPI: Patient is a pleasant healthy 70 year old woman with a chief complaint of left greater than right knee pain.  She recalls having injections in the past in the left and it helped.  She is also status post a left knee arthroscopy a while ago performed by Dr. Sharol Given.  She notices her pain mostly when she is going downstairs  Assessment & Plan: Visit Diagnoses:  1. Chronic pain of both knees     Plan: Patellofemoral and medial compartment arthritis bilateral knees.  Talked about the natural history of this.  I did talk to her about quadricep strengthening exercises and demonstrated this to her today.  She will get Voltaren gel to use topically as needed.  She like to go forward with bilateral injections today  Follow-Up Instructions: No follow-ups on file.   Ortho Exam  Patient is alert, oriented, no adenopathy, well-dressed, normal affect, normal respiratory effort. Bilateral knees she has no effusions no erythema no cellulitis or signs of infection.  She has varus malalignment worse on the left than the right.  She has no patellar mobility.  Tender more over the medial joint lines.  Mild soft tissue swelling  Imaging: No results found. No images are attached to the encounter.  Labs: No results found for: HGBA1C, ESRSEDRATE, CRP, LABURIC, REPTSTATUS, GRAMSTAIN, CULT, LABORGA   No results found for: ALBUMIN, PREALBUMIN, CBC  No results found for: MG No results found for: VD25OH  No results found for: PREALBUMIN No flowsheet data found.   There is no height or weight on file to calculate BMI.  Orders:  Orders Placed This  Encounter  Procedures  . XR Knee 1-2 Views Right  . XR Knee 1-2 Views Left   No orders of the defined types were placed in this encounter.    Procedures: Large Joint Inj: bilateral knee on 01/05/2021 10:58 AM Indications: pain and diagnostic evaluation Details: 22 G 1.5 in needle  Arthrogram: No  Medications (Right): 5 mL lidocaine 1 %; 40 mg methylPREDNISolone acetate 40 MG/ML Medications (Left): 5 mL lidocaine 1 %; 40 mg methylPREDNISolone acetate 40 MG/ML Outcome: tolerated well, no immediate complications Procedure, treatment alternatives, risks and benefits explained, specific risks discussed. Consent was given by the patient.      Clinical Data: No additional findings.  ROS:  All other systems negative, except as noted in the HPI. Review of Systems  Objective: Vital Signs: There were no vitals taken for this visit.  Specialty Comments:  No specialty comments available.  PMFS History: Patient Active Problem List   Diagnosis Date Noted  . Osteoporosis   . Beta thalassemia trait   . Hypertension    Past Medical History:  Diagnosis Date  . Beta thalassemia trait   . Hypertension   . Osteoporosis 10/2011   T score -1.8 prior DEXA 2008 with T score of -3.1    Family History  Problem Relation Age of Onset  . Hypertension Father   .  Heart disease Father   . CAD Father   . Hypertension Sister   . Osteoporosis Sister   . Multiple myeloma Sister   . Hypertension Sister   . Osteoporosis Sister   . CAD Sister   . Hypertension Sister   . Hypertension Brother   . Healthy Sister   . Healthy Sister   . Healthy Sister   . Healthy Brother   . Healthy Brother   . Breast cancer Neg Hx     Past Surgical History:  Procedure Laterality Date  . ABDOMINAL HYSTERECTOMY  1990   TAH,BSO for endometriosis  . GYNECOLOGIC CRYOSURGERY  1983   Moderate dysplasia  . KNEE ARTHROSCOPY    . OOPHORECTOMY     BSO   Social History   Occupational History  . Not on file   Tobacco Use  . Smoking status: Never Smoker  . Smokeless tobacco: Never Used  Substance and Sexual Activity  . Alcohol use: No  . Drug use: No  . Sexual activity: Never    Birth control/protection: Surgical

## 2021-01-11 ENCOUNTER — Other Ambulatory Visit: Payer: Self-pay

## 2021-01-11 ENCOUNTER — Ambulatory Visit
Admission: RE | Admit: 2021-01-11 | Discharge: 2021-01-11 | Disposition: A | Discharge status: Home / Self Care | Payer: Medicare Other | Source: Ambulatory Visit | Attending: Internal Medicine | Admitting: Internal Medicine

## 2021-01-11 DIAGNOSIS — Z1231 Encounter for screening mammogram for malignant neoplasm of breast: Secondary | ICD-10-CM | POA: Diagnosis not present

## 2021-02-01 ENCOUNTER — Ambulatory Visit (INDEPENDENT_AMBULATORY_CARE_PROVIDER_SITE_OTHER): Payer: Medicare Other | Admitting: Physician Assistant

## 2021-02-01 ENCOUNTER — Encounter: Payer: Self-pay | Admitting: Orthopedic Surgery

## 2021-02-01 DIAGNOSIS — M1711 Unilateral primary osteoarthritis, right knee: Secondary | ICD-10-CM

## 2021-02-01 DIAGNOSIS — M1712 Unilateral primary osteoarthritis, left knee: Secondary | ICD-10-CM | POA: Diagnosis not present

## 2021-02-01 NOTE — Progress Notes (Signed)
Office Visit Note   Patient: Christina Casey           Date of Birth: 1950/10/08           MRN: [id number] Visit Date: 02/01/2021              Requested by: Wenda Low, MD [address] [place],  [place] [address] PCP: Wenda Low, MD  Chief Complaint  Patient presents with   Left Knee - Follow-up    S/p injections 01/05/21   Right Knee - Follow-up      HPI: Patient is a pleasant 70 year old woman who comes in to follow-up today on her bilateral steroid injections into her knees.  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  She admits that she did not do the quad strengthening because her legs just felt "tired ".  She did feel like the heaviness in her legs was improved with a steroid injection.  She denies any radiation of symptoms down to her feet or toes.  She did not get the long-lasting relief as she had with previous injections  Assessment & Plan: Visit Diagnoses: No diagnosis found.  Plan: We will go forward for authorization for viscosupplementation.  In the meantime she says she will be committed to working on the close chain strengthening exercises we discussed.  She will follow-up for viscosupplementation when approved  Follow-Up Instructions: No follow-ups on file.   Ortho Exam  Patient is alert, oriented, no adenopathy, well-dressed, normal affect, normal respiratory effort. Right knee: No erythema no cellulitis no tenderness today to palpation good extension and flexion. Left knee: No effusion no erythema mild soft tissue swelling mild tenderness around the patellofemoral joint.  Imaging: No results found.   Labs: No results found for: HGBA1C, ESRSEDRATE, CRP, LABURIC, REPTSTATUS, GRAMSTAIN, CULT, LABORGA   No results found for: ALBUMIN, PREALBUMIN, CBC  No results found for: MG No results found for: VD25OH  No results found for: PREALBUMIN No flowsheet data found.   There  is no height or weight on file to calculate BMI.  Orders:  No orders of the defined types were placed in this encounter.  No orders of the defined types were placed in this encounter.    Procedures: No procedures performed  Clinical Data: No additional findings.  ROS:  All other systems negative, except as noted in the HPI. Review of Systems  Objective: Vital Signs: There were no vitals taken for this visit.  Specialty Comments:  No specialty comments available.  PMFS History: Patient Active Problem List   Diagnosis Date Noted   Osteoporosis    Beta thalassemia trait    Hypertension    Past Medical History:  Diagnosis Date   Beta thalassemia trait    Hypertension    Osteoporosis 10/2011   T score -1.8 prior DEXA 2008 with T score of -3.1    Family History  Problem Relation Age of Onset   Hypertension Father    Heart disease Father    CAD Father    Hypertension Sister    Osteoporosis Sister    Multiple myeloma Sister    Hypertension Sister    Osteoporosis Sister    CAD Sister    Hypertension Sister    Hypertension Brother    Healthy Sister    Healthy Sister    Healthy Sister    Healthy Brother    Healthy Brother    Breast cancer Neg Hx  Past Surgical History:  Procedure Laterality Date   ABDOMINAL HYSTERECTOMY  1990   TAH,BSO for endometriosis   GYNECOLOGIC CRYOSURGERY  1983   Moderate dysplasia   KNEE ARTHROSCOPY     OOPHORECTOMY     BSO   Social History   Occupational History   Not on file  Tobacco Use   Smoking status: Never   Smokeless tobacco: Never  Substance and Sexual Activity   Alcohol use: No   Drug use: No   Sexual activity: Never    Birth control/protection: Surgical

## 2021-02-02 ENCOUNTER — Telehealth: Payer: Self-pay

## 2021-02-02 NOTE — Telephone Encounter (Signed)
Noted.  Will submit.  

## 2021-02-02 NOTE — Telephone Encounter (Signed)
-----   Message from Rodena Medin, [place] sent at 02/01/2021  8:15 AM EDT ----- Regarding: gel injections Bilateral knee OA request for bilat knee gel injection authorization

## 2021-02-20 ENCOUNTER — Telehealth: Payer: Self-pay

## 2021-02-20 NOTE — Telephone Encounter (Signed)
VOB submitted for SynviscOne, bilateral knee. Pending BV. 

## 2021-02-22 ENCOUNTER — Telehealth: Payer: Self-pay

## 2021-02-22 ENCOUNTER — Telehealth: Payer: Self-pay | Admitting: Orthopedic Surgery

## 2021-02-22 NOTE — Telephone Encounter (Signed)
Approved for SynviscOne, bilateral knee. [place] deductible has been met Covered @ 100% of the allowed amount through secondary insurance [employer] FEP No Co-pay No PA required  Appt.03/01/2021 with Dr. Sharol Given

## 2021-03-01 ENCOUNTER — Other Ambulatory Visit: Payer: Self-pay

## 2021-03-01 ENCOUNTER — Ambulatory Visit (INDEPENDENT_AMBULATORY_CARE_PROVIDER_SITE_OTHER): Payer: Medicare Other | Admitting: Physician Assistant

## 2021-03-01 ENCOUNTER — Encounter: Payer: Self-pay | Admitting: Orthopedic Surgery

## 2021-03-01 DIAGNOSIS — M170 Bilateral primary osteoarthritis of knee: Secondary | ICD-10-CM | POA: Diagnosis not present

## 2021-03-01 DIAGNOSIS — M1711 Unilateral primary osteoarthritis, right knee: Secondary | ICD-10-CM

## 2021-03-01 DIAGNOSIS — M1712 Unilateral primary osteoarthritis, left knee: Secondary | ICD-10-CM

## 2021-03-01 MED ORDER — HYLAN G-F 20 48 MG/6ML IX SOSY
48.0000 mg | PREFILLED_SYRINGE | INTRA_ARTICULAR | Status: AC | PRN
  Administered 2021-03-01: 48 mg via INTRA_ARTICULAR

## 2021-03-01 NOTE — Progress Notes (Signed)
Office Visit Note   Patient: Christina Casey           Date of Birth: [birth year]           MRN: [id number] Visit Date: 03/01/2021              Requested by: Wenda Low, MD [address] [place],  [place] [address] PCP: Wenda Low, MD  Chief Complaint  Patient presents with  . Right Knee - Follow-up    Synvisc one bilateral knee injections buy and bill   . Left Knee - Follow-up      HPI: Patient presents in follow-up today.  She is here to have her Synvisc 1 injections into her knees.  Assessment & Plan: Visit Diagnoses: No diagnosis found.  Plan: She may follow-up as needed discussed that if she had any redness in the knee she is to contact us immediately.  Follow-Up Instructions: No follow-ups on file.   Ortho Exam  Patient is alert, oriented, no adenopathy, well-dressed, normal affect, normal respiratory effort. Bilateral knees she has no effusion no cellulitis no signs of infection  Imaging: No results found. No images are attached to the encounter.  Labs: No results found for: HGBA1C, ESRSEDRATE, CRP, LABURIC, REPTSTATUS, GRAMSTAIN, CULT, LABORGA   No results found for: ALBUMIN, PREALBUMIN, CBC  No results found for: MG No results found for: VD25OH  No results found for: PREALBUMIN No flowsheet data found.   There is no height or weight on file to calculate BMI.  Orders:  No orders of the defined types were placed in this encounter.  No orders of the defined types were placed in this encounter.    Procedures: Large Joint Inj: bilateral knee on 03/01/2021 8:50 AM Indications: pain and diagnostic evaluation Details: 22 G 1.5 in needle, anteromedial approach  Arthrogram: No  Medications (Right): 48 mg Hylan 48 MG/6ML Medications (Left): 48 mg Hylan 48 MG/6ML Outcome: tolerated well, no immediate complications Procedure, treatment alternatives, risks and benefits explained, specific risks discussed. Consent was given by the  patient.    Clinical Data: No additional findings.  ROS:  All other systems negative, except as noted in the HPI. Review of Systems  Objective: Vital Signs: There were no vitals taken for this visit.  Specialty Comments:  No specialty comments available.  PMFS History: Patient Active Problem List   Diagnosis Date Noted  . Osteoporosis   . Beta thalassemia trait   . Hypertension    Past Medical History:  Diagnosis Date  . Beta thalassemia trait   . Hypertension   . Osteoporosis 10/2011   T score -1.8 prior DEXA 2008 with T score of -3.1    Family History  Problem Relation Age of Onset  . Hypertension Father   . Heart disease Father   . CAD Father   . Hypertension Sister   . Osteoporosis Sister   . Multiple myeloma Sister   . Hypertension Sister   . Osteoporosis Sister   . CAD Sister   . Hypertension Sister   . Hypertension Brother   . Healthy Sister   . Healthy Sister   . Healthy Sister   . Healthy Brother   . Healthy Brother   . Breast cancer Neg Hx     Past Surgical History:  Procedure Laterality Date  . ABDOMINAL HYSTERECTOMY  1990   TAH,BSO for endometriosis  . GYNECOLOGIC CRYOSURGERY  1983   Moderate dysplasia  . KNEE ARTHROSCOPY    .  OOPHORECTOMY     BSO   Social History   Occupational History  . Not on file  Tobacco Use  . Smoking status: Never  . Smokeless tobacco: Never  Substance and Sexual Activity  . Alcohol use: No  . Drug use: No  . Sexual activity: Never    Birth control/protection: Surgical

## 2021-03-16 DIAGNOSIS — R7303 Prediabetes: Secondary | ICD-10-CM | POA: Diagnosis not present

## 2021-03-16 DIAGNOSIS — M8580 Other specified disorders of bone density and structure, unspecified site: Secondary | ICD-10-CM | POA: Diagnosis not present

## 2021-03-16 DIAGNOSIS — M179 Osteoarthritis of knee, unspecified: Secondary | ICD-10-CM | POA: Diagnosis not present

## 2021-03-16 DIAGNOSIS — I10 Essential (primary) hypertension: Secondary | ICD-10-CM | POA: Diagnosis not present

## 2021-06-28 DIAGNOSIS — R7303 Prediabetes: Secondary | ICD-10-CM | POA: Diagnosis not present

## 2021-06-28 DIAGNOSIS — D509 Iron deficiency anemia, unspecified: Secondary | ICD-10-CM | POA: Diagnosis not present

## 2021-06-28 DIAGNOSIS — I10 Essential (primary) hypertension: Secondary | ICD-10-CM | POA: Diagnosis not present

## 2021-06-28 DIAGNOSIS — Z23 Encounter for immunization: Secondary | ICD-10-CM | POA: Diagnosis not present

## 2021-06-28 DIAGNOSIS — E559 Vitamin D deficiency, unspecified: Secondary | ICD-10-CM | POA: Diagnosis not present

## 2021-12-07 ENCOUNTER — Other Ambulatory Visit: Payer: Self-pay | Admitting: Internal Medicine

## 2021-12-07 DIAGNOSIS — M8580 Other specified disorders of bone density and structure, unspecified site: Secondary | ICD-10-CM | POA: Diagnosis not present

## 2021-12-07 DIAGNOSIS — Z1331 Encounter for screening for depression: Secondary | ICD-10-CM | POA: Diagnosis not present

## 2021-12-07 DIAGNOSIS — E7800 Pure hypercholesterolemia, unspecified: Secondary | ICD-10-CM | POA: Diagnosis not present

## 2021-12-07 DIAGNOSIS — D509 Iron deficiency anemia, unspecified: Secondary | ICD-10-CM | POA: Diagnosis not present

## 2021-12-07 DIAGNOSIS — R7303 Prediabetes: Secondary | ICD-10-CM | POA: Diagnosis not present

## 2021-12-07 DIAGNOSIS — E669 Obesity, unspecified: Secondary | ICD-10-CM | POA: Diagnosis not present

## 2021-12-07 DIAGNOSIS — E559 Vitamin D deficiency, unspecified: Secondary | ICD-10-CM | POA: Diagnosis not present

## 2021-12-07 DIAGNOSIS — I10 Essential (primary) hypertension: Secondary | ICD-10-CM | POA: Diagnosis not present

## 2021-12-07 DIAGNOSIS — Z0000 Encounter for general adult medical examination without abnormal findings: Secondary | ICD-10-CM | POA: Diagnosis not present

## 2021-12-11 ENCOUNTER — Encounter: Payer: Self-pay | Admitting: Family

## 2021-12-11 ENCOUNTER — Ambulatory Visit (INDEPENDENT_AMBULATORY_CARE_PROVIDER_SITE_OTHER): Payer: Medicare Other | Admitting: Family

## 2021-12-11 DIAGNOSIS — M1712 Unilateral primary osteoarthritis, left knee: Secondary | ICD-10-CM

## 2021-12-11 DIAGNOSIS — M25562 Pain in left knee: Secondary | ICD-10-CM | POA: Diagnosis not present

## 2021-12-11 DIAGNOSIS — M1711 Unilateral primary osteoarthritis, right knee: Secondary | ICD-10-CM

## 2021-12-11 DIAGNOSIS — M170 Bilateral primary osteoarthritis of knee: Secondary | ICD-10-CM

## 2021-12-11 DIAGNOSIS — M25561 Pain in right knee: Secondary | ICD-10-CM

## 2021-12-11 DIAGNOSIS — G8929 Other chronic pain: Secondary | ICD-10-CM

## 2021-12-11 MED ORDER — METHYLPREDNISOLONE ACETATE 40 MG/ML IJ SUSP
40.0000 mg | INTRAMUSCULAR | Status: AC | PRN
  Administered 2021-12-11: 40 mg via INTRA_ARTICULAR

## 2021-12-11 MED ORDER — LIDOCAINE HCL 1 % IJ SOLN
5.0000 mL | INTRAMUSCULAR | Status: AC | PRN
  Administered 2021-12-11: 5 mL

## 2021-12-11 NOTE — Progress Notes (Signed)
? ?Office Visit Note ?  ?Patient: Christina Casey           ?Date of Birth: 1951-06-20           ?MRN: [id number] ?Visit Date: 12/11/2021 ?             ?Requested by: Wenda Low, MD ?[address] ?[address] ?[place],  [place] [address] ?PCP: Wenda Low, MD ? ?No chief complaint on file. ? ? ? ? ?HPI: ?The patient is a 71 year old woman who presents today for evaluation of bilateral knee pain this has been ongoing for many years has a history of osteoarthritis.  She was last seen in the office in July of last year when she received some omental injections bilateral knees she states that she did not get any relief from this.  However she has not been suffering much since then she has gradually had worsening of her pain over the last several months and presents today requesting cortisone injections ? ?She has left greater than right knee pain.  With varus alignment.  She also has patellofemoral arthritis.  She did attend physical therapy over a year ago but felt this did not provide her any relief just wore her out. ? ?Assessment & Plan: ?Visit Diagnoses: No diagnosis found. ? ?Plan: Cortisone injection bilateral knees.  Patient tolerated well.  Follow-up in the office as needed ? ?Follow-Up Instructions: No follow-ups on file.  ? ?Right Knee Exam  ? ?Muscle Strength  ?The patient has normal right knee strength. ? ?Tenderness  ?The patient is experiencing tenderness in the medial joint line. ? ?Range of Motion  ?The patient has normal right knee ROM. ? ?Other  ?Swelling: mild ?Effusion: no effusion present ? ? ?Left Knee Exam  ? ?Muscle Strength  ?The patient has normal left knee strength. ? ?Tenderness  ?The patient is experiencing tenderness in the medial joint line. ? ?Range of Motion  ?The patient has normal left knee ROM. ? ?Other  ?Swelling: mild ?Effusion: no effusion present ? ? ? ? ?Patient is alert, oriented, no adenopathy, well-dressed, normal affect, normal respiratory effort. ? ? ?Imaging: ?No  results found. ?No images are attached to the encounter. ? ?Labs: ?No results found for: HGBA1C, ESRSEDRATE, CRP, LABURIC, REPTSTATUS, GRAMSTAIN, CULT, LABORGA ? ? ?No results found for: ALBUMIN, PREALBUMIN, CBC ? ?No results found for: MG ?No results found for: VD25OH ? ?No results found for: PREALBUMIN ?   ? View : No data to display.  ?  ?  ?  ? ? ? ?There is no height or weight on file to calculate BMI. ? ?Orders:  ?No orders of the defined types were placed in this encounter. ? ?No orders of the defined types were placed in this encounter. ? ? ? Procedures: ?Large Joint Inj: bilateral knee on 12/11/2021 8:37 AM ?Indications: pain ?Details: 18 G 1.5 in needle, anteromedial approach ?Medications (Right): 5 mL lidocaine 1 %; 40 mg methylPREDNISolone acetate 40 MG/ML ?Medications (Left): 5 mL lidocaine 1 %; 40 mg methylPREDNISolone acetate 40 MG/ML ?Consent was given by the patient.  ? ? ? ?Clinical Data: ?No additional findings. ? ?ROS: ? ?All other systems negative, except as noted in the HPI. ?Review of Systems ? ?Objective: ?Vital Signs: There were no vitals taken for this visit. ? ?Specialty Comments:  ?No specialty comments available. ? ?PMFS History: ?Patient Active Problem List  ? Diagnosis Date Noted  ? Osteoporosis   ? Beta thalassemia trait   ? Hypertension   ? ?  Past Medical History:  ?Diagnosis Date  ? Beta thalassemia trait   ? Hypertension   ? Osteoporosis 10/2011  ? T score -1.8 prior DEXA 2008 with T score of -3.1  ?  ?Family History  ?Problem Relation Age of Onset  ? Hypertension Father   ? Heart disease Father   ? CAD Father   ? Hypertension Sister   ? Osteoporosis Sister   ? Multiple myeloma Sister   ? Hypertension Sister   ? Osteoporosis Sister   ? CAD Sister   ? Hypertension Sister   ? Hypertension Brother   ? Healthy Sister   ? Healthy Sister   ? Healthy Sister   ? Healthy Brother   ? Healthy Brother   ? Breast cancer Neg Hx   ?  ?Past Surgical History:  ?Procedure Laterality Date  ? ABDOMINAL  HYSTERECTOMY  1990  ? TAH,BSO for endometriosis  ? [place]  ? Moderate dysplasia  ? KNEE ARTHROSCOPY    ? OOPHORECTOMY    ? BSO  ? ?Social History  ? ?Occupational History  ? Not on file  ?Tobacco Use  ? Smoking status: Never  ? Smokeless tobacco: Never  ?Substance and Sexual Activity  ? Alcohol use: No  ? Drug use: No  ? Sexual activity: Never  ?  Birth control/protection: Surgical  ? ? ? ? ? ?

## 2021-12-12 ENCOUNTER — Other Ambulatory Visit: Payer: Self-pay | Admitting: Internal Medicine

## 2021-12-12 DIAGNOSIS — Z1231 Encounter for screening mammogram for malignant neoplasm of breast: Secondary | ICD-10-CM

## 2022-01-17 ENCOUNTER — Ambulatory Visit
Admission: RE | Admit: 2022-01-17 | Discharge: 2022-01-17 | Disposition: A | Discharge status: Home / Self Care | Payer: Medicare Other | Source: Ambulatory Visit | Attending: Internal Medicine | Admitting: Internal Medicine

## 2022-01-17 DIAGNOSIS — Z1231 Encounter for screening mammogram for malignant neoplasm of breast: Secondary | ICD-10-CM

## 2022-01-18 ENCOUNTER — Other Ambulatory Visit: Payer: Self-pay | Admitting: Internal Medicine

## 2022-01-18 DIAGNOSIS — R928 Other abnormal and inconclusive findings on diagnostic imaging of breast: Secondary | ICD-10-CM

## 2022-01-31 ENCOUNTER — Ambulatory Visit
Admission: RE | Admit: 2022-01-31 | Discharge: 2022-01-31 | Disposition: A | Discharge status: Home / Self Care | Payer: Medicare Other | Source: Ambulatory Visit | Attending: Internal Medicine | Admitting: Internal Medicine

## 2022-01-31 ENCOUNTER — Ambulatory Visit: Payer: Medicare Other

## 2022-01-31 DIAGNOSIS — R922 Inconclusive mammogram: Secondary | ICD-10-CM | POA: Diagnosis not present

## 2022-01-31 DIAGNOSIS — R928 Other abnormal and inconclusive findings on diagnostic imaging of breast: Secondary | ICD-10-CM

## 2022-02-08 DIAGNOSIS — R7303 Prediabetes: Secondary | ICD-10-CM | POA: Diagnosis not present

## 2022-02-08 DIAGNOSIS — E7800 Pure hypercholesterolemia, unspecified: Secondary | ICD-10-CM | POA: Diagnosis not present

## 2022-02-08 DIAGNOSIS — I10 Essential (primary) hypertension: Secondary | ICD-10-CM | POA: Diagnosis not present

## 2022-02-08 DIAGNOSIS — E669 Obesity, unspecified: Secondary | ICD-10-CM | POA: Diagnosis not present

## 2022-02-08 DIAGNOSIS — E559 Vitamin D deficiency, unspecified: Secondary | ICD-10-CM | POA: Diagnosis not present

## 2022-05-31 ENCOUNTER — Ambulatory Visit
Admission: RE | Admit: 2022-05-31 | Discharge: 2022-05-31 | Disposition: A | Discharge status: Home / Self Care | Payer: Medicare Other | Source: Ambulatory Visit | Attending: Internal Medicine | Admitting: Internal Medicine

## 2022-05-31 DIAGNOSIS — Z780 Asymptomatic menopausal state: Secondary | ICD-10-CM | POA: Diagnosis not present

## 2022-05-31 DIAGNOSIS — M8580 Other specified disorders of bone density and structure, unspecified site: Secondary | ICD-10-CM

## 2022-05-31 DIAGNOSIS — M8588 Other specified disorders of bone density and structure, other site: Secondary | ICD-10-CM | POA: Diagnosis not present

## 2022-10-17 DIAGNOSIS — H40023 Open angle with borderline findings, high risk, bilateral: Secondary | ICD-10-CM | POA: Diagnosis not present

## 2022-10-17 DIAGNOSIS — H04123 Dry eye syndrome of bilateral lacrimal glands: Secondary | ICD-10-CM | POA: Diagnosis not present

## 2022-10-17 DIAGNOSIS — H43812 Vitreous degeneration, left eye: Secondary | ICD-10-CM | POA: Diagnosis not present

## 2022-10-17 DIAGNOSIS — H26492 Other secondary cataract, left eye: Secondary | ICD-10-CM | POA: Diagnosis not present

## 2022-10-17 DIAGNOSIS — Z961 Presence of intraocular lens: Secondary | ICD-10-CM | POA: Diagnosis not present

## 2022-12-12 DIAGNOSIS — D509 Iron deficiency anemia, unspecified: Secondary | ICD-10-CM | POA: Diagnosis not present

## 2022-12-12 DIAGNOSIS — M8580 Other specified disorders of bone density and structure, unspecified site: Secondary | ICD-10-CM | POA: Diagnosis not present

## 2022-12-12 DIAGNOSIS — E041 Nontoxic single thyroid nodule: Secondary | ICD-10-CM | POA: Diagnosis not present

## 2022-12-12 DIAGNOSIS — E7800 Pure hypercholesterolemia, unspecified: Secondary | ICD-10-CM | POA: Diagnosis not present

## 2022-12-12 DIAGNOSIS — I10 Essential (primary) hypertension: Secondary | ICD-10-CM | POA: Diagnosis not present

## 2022-12-12 DIAGNOSIS — R7303 Prediabetes: Secondary | ICD-10-CM | POA: Diagnosis not present

## 2022-12-12 DIAGNOSIS — Z0000 Encounter for general adult medical examination without abnormal findings: Secondary | ICD-10-CM | POA: Diagnosis not present

## 2022-12-12 DIAGNOSIS — Z1331 Encounter for screening for depression: Secondary | ICD-10-CM | POA: Diagnosis not present

## 2022-12-12 DIAGNOSIS — E559 Vitamin D deficiency, unspecified: Secondary | ICD-10-CM | POA: Diagnosis not present

## 2022-12-12 DIAGNOSIS — M179 Osteoarthritis of knee, unspecified: Secondary | ICD-10-CM | POA: Diagnosis not present

## 2023-01-28 ENCOUNTER — Other Ambulatory Visit: Payer: Self-pay | Admitting: Internal Medicine

## 2023-01-28 DIAGNOSIS — Z1231 Encounter for screening mammogram for malignant neoplasm of breast: Secondary | ICD-10-CM

## 2023-02-04 ENCOUNTER — Ambulatory Visit
Admission: RE | Admit: 2023-02-04 | Discharge: 2023-02-04 | Disposition: A | Discharge status: Home / Self Care | Payer: Medicare Other | Source: Ambulatory Visit | Attending: Internal Medicine | Admitting: Internal Medicine

## 2023-02-04 DIAGNOSIS — Z1231 Encounter for screening mammogram for malignant neoplasm of breast: Secondary | ICD-10-CM | POA: Diagnosis not present

## 2023-07-17 DIAGNOSIS — Z23 Encounter for immunization: Secondary | ICD-10-CM | POA: Diagnosis not present

## 2023-07-17 DIAGNOSIS — E559 Vitamin D deficiency, unspecified: Secondary | ICD-10-CM | POA: Diagnosis not present

## 2023-07-17 DIAGNOSIS — M8580 Other specified disorders of bone density and structure, unspecified site: Secondary | ICD-10-CM | POA: Diagnosis not present

## 2023-07-17 DIAGNOSIS — R7303 Prediabetes: Secondary | ICD-10-CM | POA: Diagnosis not present

## 2023-07-17 DIAGNOSIS — I10 Essential (primary) hypertension: Secondary | ICD-10-CM | POA: Diagnosis not present

## 2023-12-17 ENCOUNTER — Ambulatory Visit (INDEPENDENT_AMBULATORY_CARE_PROVIDER_SITE_OTHER): Admitting: Orthopedic Surgery

## 2023-12-17 DIAGNOSIS — M170 Bilateral primary osteoarthritis of knee: Secondary | ICD-10-CM

## 2023-12-17 DIAGNOSIS — M1711 Unilateral primary osteoarthritis, right knee: Secondary | ICD-10-CM

## 2023-12-17 DIAGNOSIS — M1712 Unilateral primary osteoarthritis, left knee: Secondary | ICD-10-CM

## 2023-12-17 MED ORDER — METHYLPREDNISOLONE ACETATE 40 MG/ML IJ SUSP
40.0000 mg | INTRAMUSCULAR | Status: AC | PRN
  Administered 2023-12-17: 40 mg via INTRA_ARTICULAR

## 2023-12-17 MED ORDER — LIDOCAINE HCL 1 % IJ SOLN
5.0000 mL | INTRAMUSCULAR | Status: AC | PRN
  Administered 2023-12-17: 5 mL

## 2023-12-17 NOTE — Progress Notes (Signed)
 Office Visit Note   Patient: Christina Casey           Date of Birth: 06-16-1951           MRN: [id number] Visit Date: 12/17/2023              Requested by: Jearldine Mina, MD [address] [place],  [place] [address] PCP: Jearldine Mina, MD  Chief Complaint  Patient presents with   Right Knee - Pain   Left Knee - Pain      HPI: The patient is a 73 year old woman who presents today for evaluation of bilateral knee pain which is chronic.  She was last seen in the office 2 years ago.  She reports her injections in March 2023 had lasted until recently.  She has had gradual return of her symptoms which include diffuse pain locking catching and giving way of the knees left worse than right.  Assessment & Plan: Visit Diagnoses: No diagnosis found.  Plan: Depo-Medrol  injections bilateral knees.  Patient tolerated well.  She will follow-up as needed.  Follow-Up Instructions: Return if symptoms worsen or fail to improve.   Right Knee Exam   Muscle Strength  The patient has normal right knee strength.  Tenderness  The patient is experiencing tenderness in the medial joint line.  Range of Motion  The patient has normal right knee ROM.  Tests  Varus: negative Valgus: negative  Other  Effusion: no effusion present   Left Knee Exam   Muscle Strength  The patient has normal left knee strength.  Tenderness  The patient is experiencing tenderness in the medial joint line.  Range of Motion  The patient has normal left knee ROM.  Tests  Varus: negative Valgus: negative  Other  Effusion: no effusion present      Patient is alert, oriented, no adenopathy, well-dressed, normal affect, normal respiratory effort. [place]  Imaging: No results found. No images are attached to the encounter.  Labs: No results found for: "HGBA1C", "ESRSEDRATE", "CRP", "LABURIC", "REPTSTATUS", "GRAMSTAIN", "CULT", "LABORGA"   No results found for: "ALBUMIN", "PREALBUMIN",  "CBC"  No results found for: "MG" No results found for: "VD25OH"  No results found for: "PREALBUMIN"     No data to display           There is no height or weight on file to calculate BMI.  Orders:  No orders of the defined types were placed in this encounter.  No orders of the defined types were placed in this encounter.    Procedures: Large Joint Inj: bilateral knee on 12/17/2023 3:20 PM Indications: pain Details: 18 G 1.5 in needle, anteromedial approach Medications (Right): 5 mL lidocaine  1 %; 40 mg methylPREDNISolone  acetate 40 MG/ML Medications (Left): 5 mL lidocaine  1 %; 40 mg methylPREDNISolone  acetate 40 MG/ML Consent was given by the patient.      Clinical Data: No additional findings.  ROS:  All other systems negative, except as noted in the HPI. Review of Systems  Objective: Vital Signs: There were no vitals taken for this visit.  Specialty Comments:  No specialty comments available.  PMFS History: Patient Active Problem List   Diagnosis Date Noted   Osteoporosis    Beta thalassemia trait    Hypertension    Past Medical History:  Diagnosis Date   Beta thalassemia trait    Hypertension    Osteoporosis 10/2011   T score -1.8 prior DEXA 2008 with T score of -3.1  Family History  Problem Relation Age of Onset   Hypertension Father    Heart disease Father    CAD Father    Hypertension Sister    Osteoporosis Sister    Multiple myeloma Sister    Hypertension Sister    Osteoporosis Sister    CAD Sister    Hypertension Sister    Hypertension Brother    Healthy Sister    Healthy Sister    Healthy Sister    Healthy Brother    Healthy Brother    Breast cancer Neg Hx     Past Surgical History:  Procedure Laterality Date   ABDOMINAL HYSTERECTOMY  1990   TAH,BSO for endometriosis   GYNECOLOGIC CRYOSURGERY  1983   Moderate dysplasia   KNEE ARTHROSCOPY     OOPHORECTOMY     BSO   Social History   Occupational History   Not on file   Tobacco Use   Smoking status: Never   Smokeless tobacco: Never  Substance and Sexual Activity   Alcohol use: No   Drug use: No   Sexual activity: Never    Birth control/protection: Surgical

## 2024-01-06 ENCOUNTER — Other Ambulatory Visit: Payer: Self-pay | Admitting: Internal Medicine

## 2024-01-06 DIAGNOSIS — Z1231 Encounter for screening mammogram for malignant neoplasm of breast: Secondary | ICD-10-CM

## 2024-02-05 ENCOUNTER — Ambulatory Visit
Admission: RE | Admit: 2024-02-05 | Discharge: 2024-02-05 | Disposition: A | Discharge status: Home / Self Care | Source: Ambulatory Visit | Attending: Internal Medicine | Admitting: Internal Medicine

## 2024-02-05 DIAGNOSIS — Z1231 Encounter for screening mammogram for malignant neoplasm of breast: Secondary | ICD-10-CM | POA: Diagnosis not present

## 2024-03-03 DIAGNOSIS — R7303 Prediabetes: Secondary | ICD-10-CM | POA: Diagnosis not present

## 2024-03-03 DIAGNOSIS — R0609 Other forms of dyspnea: Secondary | ICD-10-CM | POA: Diagnosis not present

## 2024-03-03 DIAGNOSIS — Z23 Encounter for immunization: Secondary | ICD-10-CM | POA: Diagnosis not present

## 2024-03-03 DIAGNOSIS — M8580 Other specified disorders of bone density and structure, unspecified site: Secondary | ICD-10-CM | POA: Diagnosis not present

## 2024-03-03 DIAGNOSIS — Z0000 Encounter for general adult medical examination without abnormal findings: Secondary | ICD-10-CM | POA: Diagnosis not present

## 2024-03-03 DIAGNOSIS — E669 Obesity, unspecified: Secondary | ICD-10-CM | POA: Diagnosis not present

## 2024-03-03 DIAGNOSIS — M179 Osteoarthritis of knee, unspecified: Secondary | ICD-10-CM | POA: Diagnosis not present

## 2024-03-03 DIAGNOSIS — E7800 Pure hypercholesterolemia, unspecified: Secondary | ICD-10-CM | POA: Diagnosis not present

## 2024-03-03 DIAGNOSIS — E559 Vitamin D deficiency, unspecified: Secondary | ICD-10-CM | POA: Diagnosis not present

## 2024-03-03 DIAGNOSIS — I10 Essential (primary) hypertension: Secondary | ICD-10-CM | POA: Diagnosis not present

## 2024-03-03 DIAGNOSIS — Z1331 Encounter for screening for depression: Secondary | ICD-10-CM | POA: Diagnosis not present

## 2024-03-03 DIAGNOSIS — D509 Iron deficiency anemia, unspecified: Secondary | ICD-10-CM | POA: Diagnosis not present

## 2024-03-03 DIAGNOSIS — E041 Nontoxic single thyroid nodule: Secondary | ICD-10-CM | POA: Diagnosis not present

## 2024-04-16 DIAGNOSIS — I10 Essential (primary) hypertension: Secondary | ICD-10-CM | POA: Diagnosis not present

## 2024-05-18 ENCOUNTER — Ambulatory Visit: Attending: Internal Medicine | Admitting: Internal Medicine

## 2024-05-18 ENCOUNTER — Encounter: Payer: Self-pay | Admitting: Internal Medicine

## 2024-05-18 VITALS — BP 134/70 | HR 66 | Ht 66.0 in | Wt 206.0 lb

## 2024-05-18 DIAGNOSIS — I10 Essential (primary) hypertension: Secondary | ICD-10-CM

## 2024-05-18 DIAGNOSIS — Z01812 Encounter for preprocedural laboratory examination: Secondary | ICD-10-CM | POA: Diagnosis not present

## 2024-05-18 DIAGNOSIS — R0602 Shortness of breath: Secondary | ICD-10-CM | POA: Diagnosis not present

## 2024-05-18 DIAGNOSIS — I2089 Other forms of angina pectoris: Secondary | ICD-10-CM | POA: Diagnosis not present

## 2024-05-18 DIAGNOSIS — R0609 Other forms of dyspnea: Secondary | ICD-10-CM

## 2024-05-18 MED ORDER — METOPROLOL TARTRATE 25 MG PO TABS: 25.0000 mg | ORAL_TABLET | Freq: Once | ORAL | 0 refills | Status: AC

## 2024-05-18 NOTE — Patient Instructions (Signed)
 Medication Instructions:  Your physician recommends that you continue on your current medications as directed. Please refer to the Current Medication list given to you today.  *If you need a refill on your cardiac medications before your next appointment, please call your pharmacy*  Lab Work: BMET within 30 days of your scheduled CT at [facility] If you have labs (blood work) drawn today and your tests are completely normal, you will receive your results only by: MyChart Message (if you have MyChart) OR A paper copy in the mail If you have any lab test that is abnormal or we need to change your treatment, we will call you to review the results.  Testing/Procedures: Coronary CTA  Echocardiogram  Follow-Up: At [facility], you and your health needs are our priority.  As part of our continuing mission to provide you with exceptional heart care, our providers are all part of one team.  This team includes your primary Cardiologist (physician) and Advanced Practice Providers or APPs (Physician Assistants and Nurse Practitioners) who all work together to provide you with the care you need, when you need it.  Your next appointment:   4-6 weeks   Provider:   Loni or APP  We recommend signing up for the patient portal called MyChart.  Sign up information is provided on this After Visit Summary.  MyChart is used to connect with patients for Virtual Visits (Telemedicine).  Patients are able to view lab/test results, encounter notes, upcoming appointments, etc.  Non-urgent messages can be sent to your provider as well.   To learn more about what you can do with MyChart, go to [email or web address].   Other Instructions   Your cardiac CT will be scheduled at one of the below locations:   [facility] [address]  [place], [place] [address] [phone]  If scheduled at the Heart and Vascular Tower at [facility] street, please enter the parking lot  using the [address] entrance and use the FREE valet service at the patient drop-off area. Enter the building and check-in with registration on the main floor.  Please follow these instructions carefully (unless otherwise directed):  An IV will be required for this test and Nitroglycerin will be given.  Hold all erectile dysfunction medications at least 3 days (72 hrs) prior to test. (Ie viagra, cialis, sildenafil, tadalafil, etc)   On the Night Before the Test: Be sure to Drink plenty of water. Do not consume any caffeinated/decaffeinated beverages or chocolate 12 hours prior to your test. Do not take any antihistamines 12 hours prior to your test.  On the Day of the Test: Drink plenty of water until 1 hour prior to the test. Do not eat any food 1 hour prior to test. You may take your regular medications prior to the test.  Take metoprolol (Lopressor) 25 mg two hours prior to test. If you take Furosemide/Hydrochlorothiazide/Spironolactone/Chlorthalidone, please HOLD on the morning of the test. Patients who wear a continuous glucose monitor MUST remove the device prior to scanning. FEMALES- please wear underwire-free bra if available, avoid dresses & tight clothing  After the Test: Drink plenty of water. After receiving IV contrast, you may experience a mild flushed feeling. This is normal. On occasion, you may experience a mild rash up to 24 hours after the test. This is not dangerous. If this occurs, you can take Benadryl 25 mg, Zyrtec, Claritin, or Allegra and increase your fluid intake. (Patients taking Tikosyn should avoid Benadryl, and may  take Zyrtec, Claritin, or Allegra) If you experience trouble breathing, this can be serious. If it is severe call 911 IMMEDIATELY. If it is mild, please call our office.  We will call to schedule your test 2-4 weeks out understanding that some insurance companies will need an authorization prior to the service being performed.   For more  information and frequently asked questions, please visit our website : [email or web address]  For non-scheduling related questions, please contact the cardiac imaging nurse navigator should you have any questions/concerns: Cardiac Imaging Nurse Navigators Direct Office Dial: [phone]   For scheduling needs, including cancellations and rescheduling, please call [place], [phone].

## 2024-05-18 NOTE — Progress Notes (Signed)
 Cardiology Office Note:  .   Date:  05/18/2024  ID:  Christina Casey, DOB 01-28-1951, MRN [id number] PCP: Ransom Other, MD  [facility] Providers Cardiologist:  None    History of Present Illness: .   Christina Casey is a 73 y.o. female.  Discussed the use of AI scribe software for clinical note transcription with the patient, who gave verbal consent to proceed.  History of Present Illness Christina Casey is a 73 year old female with hypertension who presents with worsening shortness of breath. She was referred by Dr. Husain for evaluation of her shortness of breath and hypertension management.  Shortness of breath has been present for three years, progressively worsening. Initially manageable, it now limits her activity, requiring rest every fifteen minutes during exertion. There is no associated chest pain or pressure.  She has gained approximately twenty pounds over eight years, now weighing 206 pounds. She believes her shortness of breath has worsened independently of this weight gain.  Her blood pressure is currently well-controlled with doxazosin, nebivolol, olmesartan HCTZ, and spironolactone. Despite this, shortness of breath persists daily.  She has a history of anemia, which she believes is stable.   There is no history of fainting, palpitations, or orthopnea. She acknowledges snoring but has not been tested for sleep apnea. Sleep is poor, averaging three to five hours per night.    ROS: negative except per HPI above.  Studies Reviewed: SABRA   EKG Interpretation Date/Time:  Tuesday May 18 2024 08:18:10 EDT Ventricular Rate:  66 PR Interval:  190 QRS Duration:  98 QT Interval:  434 QTC Calculation: 454 R Axis:   7  Text Interpretation: Normal sinus rhythm Normal ECG When compared with ECG of 10-Sep-2005 14:19, No significant change was found Confirmed by Loni Rushing ([id number]) on 05/18/2024 8:55:45 AM    Results LABS LDL: 111 mg/dL  DIAGNOSTIC ECG:  Normal Risk Assessment/Calculations:       Physical Exam:   VS:  BP 134/70   Pulse 66   Ht 5' 6 (1.676 m)   Wt 206 lb (93.4 kg)   SpO2 96%   BMI 33.25 kg/m    Wt Readings from Last 3 Encounters:  05/18/24 206 lb (93.4 kg)  08/20/18 183 lb 9.6 oz (83.3 kg)  06/12/16 183 lb 9.6 oz (83.3 kg)     Physical Exam MEASUREMENTS: Weight- 206. GENERAL: Alert, cooperative, well developed, no acute distress. HEENT: Normocephalic, normal oropharynx, moist mucous membranes. CHEST: Clear to auscultation bilaterally, no wheezes, rhonchi, or crackles. CARDIOVASCULAR: Normal heart rate and rhythm, S1 and S2 normal without murmurs. ABDOMEN: Soft, non-tender, non-distended, without organomegaly, normal bowel sounds. EXTREMITIES: No cyanosis or edema. NEUROLOGICAL: Cranial nerves grossly intact, moves all extremities without gross motor or sensory deficit.   ASSESSMENT AND PLAN: .    Assessment and Plan Assessment & Plan Dyspnea (shortness of breath) evaluation Chronic dyspnea worsening over three years, with differential diagnosis including cardiac causes, anemia, and potential sleep apnea. Nocturnal symptoms and snoring noted, no sleep apnea testing history. - Order coronary CT scan to evaluate for plaque, calcium, and blockages. - Order echocardiogram to assess for heart damage due to hypertension. - Administer metoprolol prior to coronary CT scan to optimize heart rate. - Discuss potential need for sleep apnea evaluation if cardiac causes are ruled out.  Essential hypertension Elevated blood pressure recently controlled with medication. Possible diastolic dysfunction due to long-standing hypertension may contribute to dyspnea. - Continue current antihypertensive regimen including doxazosin 4  mg daily, nebivolol 20 mg daily, olmesartan hydrochlorothiazide 40-12.5 mg daily, and spironolactone 25 mg.  Anemia, unspecified Anemia with borderline low blood counts. Non-compliance with iron  supplementation noted. Anemia is a potential contributor to dyspnea. - Encourage daily iron supplementation. - Reassess anemia if cardiac evaluation does not reveal a cause for dyspnea.        Soyla Merck, MD, FACC

## 2024-05-24 ENCOUNTER — Encounter (HOSPITAL_COMMUNITY): Payer: Self-pay

## 2024-05-25 DIAGNOSIS — I10 Essential (primary) hypertension: Secondary | ICD-10-CM | POA: Diagnosis not present

## 2024-05-25 DIAGNOSIS — Z01812 Encounter for preprocedural laboratory examination: Secondary | ICD-10-CM | POA: Diagnosis not present

## 2024-05-25 LAB — BASIC METABOLIC PANEL WITH GFR
BUN/Creatinine Ratio: 27 (ref 12–28)
BUN: 32 mg/dL — ABNORMAL HIGH (ref 8–27)
CO2: 21 mmol/L (ref 20–29)
Calcium: 9.2 mg/dL (ref 8.7–10.3)
Chloride: 102 mmol/L (ref 96–106)
Creatinine, Ser: 1.17 mg/dL — ABNORMAL HIGH (ref 0.57–1.00)
Glucose: 120 mg/dL — ABNORMAL HIGH (ref 70–99)
Potassium: 4 mmol/L (ref 3.5–5.2)
Sodium: 138 mmol/L (ref 134–144)
eGFR: 50 mL/min/1.73 — ABNORMAL LOW (ref >59)

## 2024-05-26 ENCOUNTER — Ambulatory Visit (HOSPITAL_COMMUNITY)
Admission: RE | Admit: 2024-05-26 | Discharge: 2024-05-26 | Disposition: A | Discharge status: Home / Self Care | Source: Ambulatory Visit | Attending: Internal Medicine | Admitting: Internal Medicine

## 2024-05-26 DIAGNOSIS — I2089 Other forms of angina pectoris: Secondary | ICD-10-CM | POA: Insufficient documentation

## 2024-05-26 DIAGNOSIS — R079 Chest pain, unspecified: Secondary | ICD-10-CM | POA: Diagnosis not present

## 2024-05-26 DIAGNOSIS — I2510 Atherosclerotic heart disease of native coronary artery without angina pectoris: Secondary | ICD-10-CM | POA: Diagnosis not present

## 2024-05-26 MED ORDER — IOHEXOL 350 MG/ML SOLN
100.0000 mL | Freq: Once | INTRAVENOUS | Status: AC | PRN
  Administered 2024-05-26: 100 mL via INTRAVENOUS

## 2024-05-26 MED ORDER — NITROGLYCERIN 0.4 MG SL SUBL
0.8000 mg | SUBLINGUAL_TABLET | Freq: Once | SUBLINGUAL | Status: AC
  Administered 2024-05-26: 0.8 mg via SUBLINGUAL

## 2024-05-29 ENCOUNTER — Ambulatory Visit: Payer: Self-pay | Admitting: Internal Medicine

## 2024-06-14 ENCOUNTER — Encounter: Payer: Self-pay | Admitting: Radiology

## 2024-06-23 NOTE — Progress Notes (Unsigned)
 Cardiology Office Note    Date:  06/25/2024  ID:  Christina Casey, DOB 02-07-1951, MRN [id number] PCP:  Ransom Other, MD  Cardiologist:  Christina DELENA Merck, MD  Electrophysiologist:  None   Chief Complaint: Follow-up for shortness of breath History of Present Illness: .   Christina Casey is a 73 y.o. female with visit-pertinent history of hypertension.  Patient first evaluated by Dr. Merck on 05/18/2024 for evaluation of worsening shortness of breath.  She had been referred by her PCP for evaluation of shortness of breath and hypertension management.  Was noted that shortness of breath have been present for the prior 3 years however have been progressively worsening resulting in limiting of her activity.  She denied any chest pain or pressure.  Was noted that her blood pressure was well-controlled on doxazosin, Nebivolol, olmesartan-HCTZ and spironolactone.  Coronary CT and echocardiogram were ordered for further evaluation.  It was noted that patient may need sleep study if unrevealing.  Coronary CT on 05/26/2024 indicated coronary calcium score of 44.2, 60th 5th percentile for age, sex and race matched control, mild atherosclerosis 25 to 49% in the mid LCx, reviewed by Dr. Merck, noted that the OM was torturous and had small caliber but was patent with no significant stenosis with nonobstructive CAD.  Echocardiogram on 06/24/2024 on review by Dr.  Merck indicated LVEF 65 to 70%, no RWMA, diastolic parameters were normal, RV systolic function and size normal, normal PASP, LA was mildly dilated, RA mildly dilated, trivial mitral valve regurgitation with no evidence of stenosis, aortic valve regurgitation is not visualized, no stenosis present, aorta normal size for age/height and weight.  Today she reports that she has been doing well overall.  She denies any chest pain, she continues to note dyspnea on exertion.  She feels that she is unable to walk as far as she used to 2 years ago as a  result of increased shortness of breath resulting in overall fatigue, denies any chest pain, tightness or pressure on exertion.  She denies any lower extremity edema, notes she did have some previously prior to starting on spironolactone.  She denies any palpitations, presyncope, syncope, orthopnea or PND.  Reviewed patient's echocardiogram results and coronary CTA, all questions answered. ROS: .   Today she denies chest pain, lower extremity edema, fatigue, palpitations, melena, hematuria, hemoptysis, diaphoresis, weakness, presyncope, syncope, orthopnea, and PND.  All other systems are reviewed and otherwise negative. Studies Reviewed: SABRA   EKG:  EKG is not ordered today. CV Studies: Cardiac studies reviewed are outlined and summarized above. Otherwise please see EMR for full report. Cardiac Studies & Procedures   ______________________________________________________________________________________________     ECHOCARDIOGRAM  ECHOCARDIOGRAM COMPLETE 06/24/2024  Narrative ECHOCARDIOGRAM REPORT    Patient Name:   Christina Casey Date of Exam: 06/24/2024 Medical Rec #:  [id number]       Height:       66.0 in Accession #:    [id number]      Weight:       206.0 lb Date of Birth:  Apr 25, 1951      BSA:          2.025 m Patient Age:    73 years        BP:           134/70 mmHg Patient Gender: F               HR:  59 bpm. Exam Location:  [address]  Procedure: 2D Echo, 3D Echo, Cardiac Doppler and Color Doppler (Both Spectral and Color Flow Doppler were utilized during procedure).  Indications:    R06.02 Shortness of Breath  History:        Patient has no prior history of Echocardiogram examinations. Signs/Symptoms:Dyspnea and Shortness of Breath; Risk Factors:Hypertension.  Sonographer:    Heather Hawks RDCS Referring Phys: Christina Casey  IMPRESSIONS   1. Left ventricular ejection fraction, by estimation, is 65 to 70%. Left ventricular ejection fraction by 3D  volume is 70 %. The left ventricle has normal function. The left ventricle has no regional wall motion abnormalities. Left ventricular diastolic parameters were normal. 2. Right ventricular systolic function is normal. The right ventricular size is normal. There is normal pulmonary artery systolic pressure. 3. Left atrial size was severely dilated. 4. Right atrial size was mildly dilated. 5. The mitral valve is normal in structure. Trivial mitral valve regurgitation. No evidence of mitral stenosis. 6. The aortic valve is tricuspid. Aortic valve regurgitation is not visualized. No aortic stenosis is present. 7. Aortic dilatation noted. There is borderline dilatation of the ascending aorta, measuring 37 mm. 8. The inferior vena cava is normal in size with greater than 50% respiratory variability, suggesting right atrial pressure of 3 mmHg. 9. Cannot exclude a small PFO.  Comparison(s): No prior Echocardiogram. .  FINDINGS Left Ventricle: Left ventricular ejection fraction, by estimation, is 65 to 70%. Left ventricular ejection fraction by 3D volume is 70 %. The left ventricle has normal function. The left ventricle has no regional wall motion abnormalities. The left ventricular internal cavity size was normal in size. There is borderline left ventricular hypertrophy. Left ventricular diastolic parameters were normal.  Right Ventricle: The right ventricular size is normal. No increase in right ventricular wall thickness. Right ventricular systolic function is normal. There is normal pulmonary artery systolic pressure. The tricuspid regurgitant velocity is 2.34 m/s, and with an assumed right atrial pressure of 3 mmHg, the estimated right ventricular systolic pressure is 24.9 mmHg.  Left Atrium: Left atrial size was severely dilated.  Right Atrium: Right atrial size was mildly dilated.  Pericardium: There is no evidence of pericardial effusion.  Mitral Valve: The mitral valve is normal in  structure. Trivial mitral valve regurgitation. No evidence of mitral valve stenosis.  Tricuspid Valve: The tricuspid valve is normal in structure. Tricuspid valve regurgitation is trivial. No evidence of tricuspid stenosis.  Aortic Valve: The aortic valve is tricuspid. Aortic valve regurgitation is not visualized. No aortic stenosis is present.  Pulmonic Valve: The pulmonic valve was normal in structure. Pulmonic valve regurgitation is trivial. No evidence of pulmonic stenosis.  Aorta: Aortic dilatation noted and the aortic root is normal in size and structure. There is borderline dilatation of the ascending aorta, measuring 37 mm.  Venous: A normal flow pattern is recorded from the right upper pulmonary vein. The inferior vena cava is normal in size with greater than 50% respiratory variability, suggesting right atrial pressure of 3 mmHg.  IAS/Shunts: Cannot exclude a small PFO.  Additional Comments: 3D was performed not requiring image post processing on an independent workstation and was normal.   LEFT VENTRICLE PLAX 2D LVIDd:         4.90 cm         Diastology LVIDs:         2.70 cm         LV e' medial:    8.54 cm/s  LV PW:         1.00 cm         LV E/e' medial:  12.2 LV IVS:        1.10 cm         LV e' lateral:   9.41 cm/s LVOT diam:     2.50 cm         LV E/e' lateral: 11.1 LV SV:         90 LV SV Index:   44 LVOT Area:     4.91 cm        3D Volume EF LV IVRT:       87 msec         LV 3D EF:    Left ventricul ar ejection fraction by 3D volume is 70 %.  3D Volume EF: 3D EF:        70 % LV EDV:       135 ml LV ESV:       40 ml LV SV:        95 ml  RIGHT VENTRICLE RV Basal diam:  3.60 cm     PULMONARY VEINS RV S prime:     10.10 cm/s  A Reversal Velocity: 66.00 cm/s TAPSE (M-mode): 2.3 cm      Diastolic Velocity:  52.50 cm/s RVSP:           24.9 mmHg   S/D Velocity:        1.90 Systolic Velocity:   99.50 cm/s  LEFT ATRIUM             Index        RIGHT ATRIUM            Index LA diam:        4.80 cm 2.37 cm/m   RA Pressure: 3.00 mmHg LA Vol (A2C):   91.5 ml 45.18 ml/m  RA Area:     18.60 cm LA Vol (A4C):   84.6 ml 41.78 ml/m  RA Volume:   54.30 ml  26.81 ml/m LA Biplane Vol: 88.7 ml 43.80 ml/m AORTIC VALVE LVOT Vmax:   74.15 cm/s LVOT Vmean:  48.800 cm/s LVOT VTI:    0.182 m  AORTA Ao Root diam: 3.20 cm Ao Asc diam:  3.70 cm  MITRAL VALVE                TRICUSPID VALVE MV Area (PHT)  cm          TR Peak grad:   21.9 mmHg MV Decel Time: 226 msec     TR Vmax:        234.00 cm/s MR Peak grad: 74.3 mmHg     Estimated RAP:  3.00 mmHg MR Mean grad: 42.0 mmHg     RVSP:           24.9 mmHg MR Vmax:      431.00 cm/s MR Vmean:     298.0 cm/s    SHUNTS MV E velocity: 104.50 cm/s  Systemic VTI:  0.18 m MV A velocity: 88.55 cm/s   Systemic Diam: 2.50 cm MV E/A ratio:  1.18  Emeline Calender Electronically signed by Emeline Calender Signature Date/Time: 06/24/2024/11:23:04 AM    Final      CT SCANS  CT CORONARY MORPH W/CTA COR W/SCORE 05/26/2024  Addendum 06/03/2024  4:19 PM ADDENDUM REPORT: 06/03/2024 16:17  EXAM: OVER-READ INTERPRETATION  CT CHEST  The following report is an over-read performed by radiologist Dr. Andrea  Sanford of [facility], [place] on 06/03/2024. This over-read does not include interpretation of cardiac or coronary anatomy or pathology. The coronary CTA interpretation by the cardiologist is attached.  COMPARISON:  None.  FINDINGS: Vascular: Minor descending aortic atherosclerosis. The included aorta is normal in caliber.  Mediastinum/nodes: No adenopathy or mass.  Small hiatal hernia.  Lungs: Scattered areas of bandlike atelectasis or scarring in both lungs. No pulmonary nodule. No pleural fluid. The included airways are patent.  Upper abdomen: No acute findings. Small posterior gastric diverticulum.  Musculoskeletal: There are no acute or suspicious osseous abnormalities.  IMPRESSION: 1. No  acute extracardiac findings. 2. Small hiatal hernia. 3. Aortic Atherosclerosis (ICD10-I70.0).   Electronically Signed By: Andrea Gasman M.D. On: 06/03/2024 16:17  Narrative CLINICAL DATA:  Chest pain  EXAM: Cardiac/Coronary CTA  TECHNIQUE: A non-contrast, gated CT scan was obtained with axial slices of 3 mm through the heart for calcium scoring. Calcium scoring was performed using the Agatston method. A 120 kV prospective, gated, contrast cardiac scan was obtained. Gantry rotation speed was 250 msecs and collimation was 0.6 mm. Two sublingual nitroglycerin tablets (0.8 mg) were given. The 3D data set was reconstructed in 5% intervals of the 35-75% of the R-R cycle. Diastolic phases were analyzed on a dedicated workstation using MPR, MIP, and VRT modes. The patient received 95 cc of contrast.  FINDINGS: Image quality: Excellent.  Noise artifact is: Limited.  Coronary Arteries:  Normal coronary origin.  Right dominance.  Left main: The left main is a large caliber vessel with a normal take off from the left coronary cusp that bifurcates to form a left anterior descending artery and a left circumflex artery. There is minimal calcified plaque in the distal LM with associated stenosis of <25%.  Left anterior descending artery: The LAD gives off 2 patent diagonal branches. There is minimal calcified plaque in the ostial LAD with associated stenosis of <25%. There is calcified plaque in the ostial SP2 but vessel caliber to small to quantitate stenosis.  Left circumflex artery: The LCX is non-dominant and gives off 2 patent obtuse marginal branches. There is mild calcified plaque in the mid LCx with associated stenosis of 25-49%. OM1 is poorly visualized.  Right coronary artery: The RCA is dominant with normal take off from the right coronary cusp. There is minimal calcified plaque in the proximal RCA with associated stenosis of < 25%. The RCA terminates as a PDA and  right posterolateral branch without evidence of plaque or stenosis.  Right Atrium: Right atrial size is within normal limits.  Right Ventricle: The right ventricular cavity is within normal limits.  Left Atrium: Left atrial size is normal in size with no left atrial appendage filling defect.  Left Ventricle: The ventricular cavity size is within normal limits.  Pulmonary arteries: Normal in size.  Pulmonary veins: Normal pulmonary venous drainage.  Pericardium: Normal thickness without significant effusion or calcium present.  Cardiac valves: The aortic valve is trileaflet without significant calcification. The mitral valve is normal without significant calcification.  Aorta: Normal caliber without significant disease.  Extra-cardiac findings: See attached radiology report for non-cardiac structures.  IMPRESSION: 1. Coronary calcium score of 44.2. This was 65th percentile for age-, sex, and race-matched controls.  2. Normal coronary origin with right dominance.  3. Mild atherosclerosis: 25-49% mid LCx.  OM1 is poorly visualized.  4. Consider non atherosclerotic causes of chest pain.  5. Consider non invasive stress testing if clinical suspicion for ischemia is high given inability  to visualize OM1.  RECOMMENDATIONS: 1. CAD-RADS 0: No evidence of CAD (0%). Consider non-atherosclerotic causes of chest pain.  2. CAD-RADS 1: Minimal non-obstructive CAD (0-24%). Consider non-atherosclerotic causes of chest pain. Consider preventive therapy and risk factor modification.  3. CAD-RADS 2: Mild non-obstructive CAD (25-49%). Consider non-atherosclerotic causes of chest pain. Consider preventive therapy and risk factor modification.  4. CAD-RADS 3: Moderate stenosis. Consider symptom-guided anti-ischemic pharmacotherapy as well as risk factor modification per guideline directed care. Additional analysis with CT FFR will be submitted.  5. CAD-RADS 4: Severe stenosis.  (70-99% or > 50% left main). Cardiac catheterization or CT FFR is recommended. Consider symptom-guided anti-ischemic pharmacotherapy as well as risk factor modification per guideline directed care. Invasive coronary angiography recommended with revascularization per published guideline statements.  6. CAD-RADS 5: Total coronary occlusion (100%). Consider cardiac catheterization or viability assessment. Consider symptom-guided anti-ischemic pharmacotherapy as well as risk factor modification per guideline directed care.  7. CAD-RADS N: Non-diagnostic study. Obstructive CAD can't be excluded. Alternative evaluation is recommended.  Wilbert Bihari, MD  Electronically Signed: By: Wilbert Bihari M.D. On: 05/26/2024 13:16     ______________________________________________________________________________________________       Current Reported Medications:.    Current Meds  Medication Sig   Cholecalciferol (VITAMIN D PO) Take 2,000 Units by mouth.   doxazosin (CARDURA) 4 MG tablet 1 tablet Orally at bedtime   IRON PO Take 1 tablet by mouth daily.    metoprolol tartrate (LOPRESSOR) 25 MG tablet Take 1 tablet (25 mg total) by mouth once for 1 dose. Take 90-120 minutes prior to scan. Hold for SBP less than 110.   nebivolol (BYSTOLIC) 10 MG tablet Take 10 mg by mouth daily.   Olmesartan Medoxomil (BENICAR PO) Take 1 tablet by mouth daily.    rosuvastatin (CRESTOR) 10 MG tablet Take 1 tablet (10 mg total) by mouth daily.   spironolactone (ALDACTONE) 25 MG tablet 1 tablet Orally Once a day; Duration: 90 days    Physical Exam:    VS:  BP (!) 110/58   Pulse 68   Ht 5' 6.75 (1.695 m)   Wt 202 lb 3.2 oz (91.7 kg)   SpO2 98%   BMI 31.91 kg/m    Wt Readings from Last 3 Encounters:  06/25/24 202 lb 3.2 oz (91.7 kg)  05/18/24 206 lb (93.4 kg)  08/20/18 183 lb 9.6 oz (83.3 kg)    GEN: Well nourished, well developed in no acute distress NECK: No JVD; No carotid bruits CARDIAC: RRR, no  murmurs, rubs, gallops RESPIRATORY:  Clear to auscultation without rales, wheezing or rhonchi  ABDOMEN: Soft, non-tender, non-distended EXTREMITIES:  No edema; No acute deformity     Asessement and Plan:.    CAD: Coronary CT on 05/26/2024 indicated coronary calcium score of 44.2, 60th 5th percentile for age, sex and race matched control, mild atherosclerosis 25 to 49% in the mid LCx, reviewed by Dr. Loni, noted that the OM was torturous and had small caliber but was patent with no significant stenosis with nonobstructive CAD.  Today she denies any chest pain, continues to note dyspnea on exertion as noted below.  Will proceed with echo with bubble study for further evaluation. Heart healthy diet and regular cardiovascular exercise encouraged.  Reviewed ED precautions.  Dyspnea on exertion/ ?PFO: Patient continues to note dyspnea on exertion that has progressively worsened in the last two years. Coronary CTA reassuring as noted above, echocardiogram with normal biventricular function, unable to rule out PFO. Patient agreeable to  limited echo with bubble study for further evaluation. She appears euvolemic and well compensated on exam.  Continue Cardura, Bystolic, olmesartan and spironolactone.  HTN: Blood pressure today 110/58.  Continue doxazosin 4 mg nightly, Nebivolol 10 mg daily, olmesartan and spironolactone 25 mg daily.  Hyperlipidemia: Last lipid profile in 03/03/2024 indicated total cholesterol 184, HDL 53, triglycerides 106 and LDL 111.  Discussed with patient given evidence of coronary calcium recommend LDL goal less than 70.  She is agreeable to starting Crestor 10 mg daily.  Check fasting lipid profile and LFTs in 6 to 8 weeks.  Snoring/?OSA: Patient notes she does feel she snores at night but denies daytime fatigue. Discussed sleep study given STOP Bang score of 4, patient deferred for now.    Disposition: F/u with [facility], NP in 8 weeks   Signed, Valen Mascaro D Anothy Bufano, NP

## 2024-06-24 ENCOUNTER — Ambulatory Visit (HOSPITAL_COMMUNITY)
Admission: RE | Admit: 2024-06-24 | Discharge: 2024-06-24 | Disposition: A | Discharge status: Home / Self Care | Source: Ambulatory Visit | Attending: Cardiology | Admitting: Cardiology

## 2024-06-24 DIAGNOSIS — R0609 Other forms of dyspnea: Secondary | ICD-10-CM | POA: Diagnosis not present

## 2024-06-24 LAB — ECHOCARDIOGRAM COMPLETE
Area-P 1/2: 3.36 cm2
MV M vel: 4.31 m/s
MV Peak grad: 74.3 mmHg
S' Lateral: 2.7 cm

## 2024-06-25 ENCOUNTER — Ambulatory Visit: Attending: Cardiology | Admitting: Cardiology

## 2024-06-25 ENCOUNTER — Encounter: Payer: Self-pay | Admitting: Cardiology

## 2024-06-25 VITALS — BP 110/58 | HR 68 | Ht 66.75 in | Wt 202.2 lb

## 2024-06-25 DIAGNOSIS — I2510 Atherosclerotic heart disease of native coronary artery without angina pectoris: Secondary | ICD-10-CM | POA: Diagnosis not present

## 2024-06-25 DIAGNOSIS — E782 Mixed hyperlipidemia: Secondary | ICD-10-CM | POA: Diagnosis present

## 2024-06-25 DIAGNOSIS — Z79899 Other long term (current) drug therapy: Secondary | ICD-10-CM | POA: Diagnosis not present

## 2024-06-25 DIAGNOSIS — R0602 Shortness of breath: Secondary | ICD-10-CM | POA: Diagnosis present

## 2024-06-25 DIAGNOSIS — R0609 Other forms of dyspnea: Secondary | ICD-10-CM | POA: Insufficient documentation

## 2024-06-25 MED ORDER — ROSUVASTATIN CALCIUM 10 MG PO TABS: 10.0000 mg | ORAL_TABLET | Freq: Every day | ORAL | 3 refills | Status: DC

## 2024-06-25 NOTE — Patient Instructions (Addendum)
 Medication Instructions:  START: Rosuvastatin (Crestor) 10 mg (1 Tablet) daily *If you need a refill on your cardiac medications before your next appointment, please call your pharmacy*  Lab Work: 6-8 weeks Labs: Fasting Lipid, LFT If you have labs (blood work) drawn today and your tests are completely normal, you will receive your results only by: MyChart Message (if you have MyChart) OR A paper copy in the mail If you have any lab test that is abnormal or we need to change your treatment, we will call you to review the results.  Testing/Procedures: Your physician has requested that you have an echocardiogram limited bubble study. Echocardiography is a painless test that uses sound waves to create images of your heart. It provides your doctor with information about the size and shape of your heart and how well your heart's chambers and valves are working. This procedure takes approximately one hour. There are no restrictions for this procedure. Please do NOT wear cologne, perfume, aftershave, or lotions (deodorant is allowed). Please arrive 15 minutes prior to your appointment time.  Please note: We ask at that you not bring children with you during ultrasound (echo/ vascular) testing. Due to room size and safety concerns, children are not allowed in the ultrasound rooms during exams. Our front office staff cannot provide observation of children in our lobby area while testing is being conducted. An adult accompanying a patient to their appointment will only be allowed in the ultrasound room at the discretion of the ultrasound technician under special circumstances. We apologize for any inconvenience.   Follow-Up: At [facility], you and your health needs are our priority.  As part of our continuing mission to provide you with exceptional heart care, our providers are all part of one team.  This team includes your primary Cardiologist (physician) and Advanced Practice Providers or APPs  (Physician Assistants and Nurse Practitioners) who all work together to provide you with the care you need, when you need it.  Your next appointment:    6-8 weeks  Provider:   Katlyn West, NP

## 2024-07-06 ENCOUNTER — Telehealth: Payer: Self-pay | Admitting: Internal Medicine

## 2024-07-06 NOTE — Telephone Encounter (Signed)
*  STAT* If patient is at the pharmacy, call can be transferred to refill team.   1. Which medications need to be refilled? (please list name of each medication and dose if known)  rosuvastatin  (CRESTOR ) 10 MG tablet  2. Which pharmacy/location (including street and city if local pharmacy) is medication to be sent to? [facility] [phone] - [place], [place] - [address] AT [facility] [address]  3. Do they need a 30 day or 90 day supply?  90 day supply  Patient says she accidentally gave the wrong pharmacy during her appointment. She would like to have her prescription transferred from [facility] to [facility] [address] if at all possible.

## 2024-07-06 NOTE — Telephone Encounter (Signed)
 Left pt a message that we will take care of her request and to check back with [facility] tomorrow.  Verified with [facility] that they have already transferred to [facility] and the refills was transferred with it.

## 2024-07-07 MED ORDER — ROSUVASTATIN CALCIUM 10 MG PO TABS: 10.0000 mg | ORAL_TABLET | Freq: Every day | ORAL | 3 refills | Status: AC

## 2024-07-07 NOTE — Addendum Note (Signed)
 Addended by: DARIO IZETTA CROME on: 07/07/2024 03:32 PM   Modules accepted: Orders

## 2024-07-23 ENCOUNTER — Ambulatory Visit (HOSPITAL_COMMUNITY)

## 2024-07-26 ENCOUNTER — Ambulatory Visit: Admitting: Physician Assistant

## 2024-07-26 ENCOUNTER — Encounter: Payer: Self-pay | Admitting: Physician Assistant

## 2024-07-26 DIAGNOSIS — M170 Bilateral primary osteoarthritis of knee: Secondary | ICD-10-CM | POA: Diagnosis not present

## 2024-07-26 DIAGNOSIS — M1712 Unilateral primary osteoarthritis, left knee: Secondary | ICD-10-CM

## 2024-07-26 DIAGNOSIS — M1711 Unilateral primary osteoarthritis, right knee: Secondary | ICD-10-CM

## 2024-07-26 MED ORDER — METHYLPREDNISOLONE ACETATE 40 MG/ML IJ SUSP
40.0000 mg | INTRAMUSCULAR | Status: AC | PRN
  Administered 2024-07-26: 09:00:00 40 mg via INTRA_ARTICULAR

## 2024-07-26 MED ORDER — LIDOCAINE HCL (PF) 1 % IJ SOLN
5.0000 mL | INTRAMUSCULAR | Status: AC | PRN
  Administered 2024-07-26: 09:00:00 5 mL

## 2024-07-26 NOTE — Progress Notes (Signed)
 Office Visit Note   Patient: Christina Casey           Date of Birth: Dec 14, 1950           MRN: [id number] Visit Date: 07/26/2024              Requested by: Ransom Other, MD [address] [place],  [place] [address] PCP: Ransom Other, MD  Chief Complaint  Patient presents with   Right Knee - Follow-up   Left Knee - Follow-up      HPI: 73 y/o female with bilateral moderate knee pain.  The left > right is painful after sitting or standing and walking for prolonged periods of time.  She has evidence of varus knee deformity with medial joint line narrowing and bone on bone.  She gets pain relief with steroid knee injections and is here today for repeat injections.  Her last knee injection was 12/17/23.  Assessment & Plan: Visit Diagnoses:  1. Primary osteoarthritis of left knee   2. Primary osteoarthritis of right knee     Plan: RICE PRN for knee pain.  Topical analgesics such as Voltaren gel.  Do daily exercises to strengthen around the knees. Sit to stand, heel and toe raises, and walk for exercise.    Follow-Up Instructions: Return if symptoms worsen or fail to improve.   Ortho Exam  Patient is alert, oriented, no adenopathy, well-dressed, normal affect, normal respiratory effort. Left > right medial joint line narrowing with crepitus of the patella femoral joint to active ROM.  No cellulitis or effusion.      Imaging: varus malalignment worse on the left than the right.   Labs: No results found for: HGBA1C, ESRSEDRATE, CRP, LABURIC, REPTSTATUS, GRAMSTAIN, CULT, LABORGA   No results found for: ALBUMIN, PREALBUMIN, CBC  No results found for: MG No results found for: VD25OH  No results found for: PREALBUMIN     No data to display           There is no height or weight on file to calculate BMI.  Orders:  Orders Placed This Encounter  Procedures   Large Joint Inj: bilateral knee   No orders of the defined  types were placed in this encounter.    Procedures: Large Joint Inj: bilateral knee on 07/26/2024 9:08 AM Indications: pain and diagnostic evaluation Details: 22 G 1.5 in needle  Arthrogram: No  Medications (Right): 5 mL lidocaine  (PF) 1 %; 40 mg methylPREDNISolone  acetate 40 MG/ML Medications (Left): 5 mL lidocaine  (PF) 1 %; 40 mg methylPREDNISolone  acetate 40 MG/ML Outcome: tolerated well, no immediate complications Procedure, treatment alternatives, risks and benefits explained, specific risks discussed. Consent was given by the patient. Immediately prior to procedure a time out was called to verify the correct patient, procedure, equipment, support staff and site/side marked as required. Patient was prepped and draped in the usual sterile fashion.      Clinical Data: No additional findings.  ROS:  All other systems negative, except as noted in the HPI. Review of Systems  Objective: Vital Signs: There were no vitals taken for this visit.  Specialty Comments:  No specialty comments available.  PMFS History: Patient Active Problem List   Diagnosis Date Noted   Osteoporosis    Beta thalassemia trait    Hypertension    Past Medical History:  Diagnosis Date   Beta thalassemia trait    Hypertension    Osteoporosis 10/2011   T score -1.8 prior DEXA 2008  with T score of -3.1    Family History  Problem Relation Age of Onset   Hypertension Father    Heart disease Father    CAD Father    Hypertension Sister    Osteoporosis Sister    Multiple myeloma Sister    Hypertension Sister    Osteoporosis Sister    CAD Sister    Hypertension Sister    Hypertension Brother    Healthy Sister    Healthy Sister    Healthy Sister    Healthy Brother    Healthy Brother    Breast cancer Neg Hx     Past Surgical History:  Procedure Laterality Date   ABDOMINAL HYSTERECTOMY  1990   TAH,BSO for endometriosis   GYNECOLOGIC CRYOSURGERY  1983   Moderate dysplasia   KNEE  ARTHROSCOPY     OOPHORECTOMY     BSO   Social History   Occupational History   Not on file  Tobacco Use   Smoking status: Never   Smokeless tobacco: Never  Substance and Sexual Activity   Alcohol use: No   Drug use: No   Sexual activity: Never    Birth control/protection: Surgical

## 2024-08-03 ENCOUNTER — Ambulatory Visit (HOSPITAL_COMMUNITY)

## 2024-08-10 ENCOUNTER — Ambulatory Visit: Admitting: Cardiology

## 2024-08-13 LAB — LIPID PANEL
Chol/HDL Ratio: 2.4 ratio (ref 0.0–4.4)
Cholesterol, Total: 113 mg/dL (ref 100–199)
HDL: 48 mg/dL
LDL Chol Calc (NIH): 47 mg/dL (ref 0–99)
Triglycerides: 97 mg/dL (ref 0–149)
VLDL Cholesterol Cal: 18 mg/dL (ref 5–40)

## 2024-08-13 LAB — HEPATIC FUNCTION PANEL
ALT: 10 IU/L (ref 0–32)
AST: 12 IU/L (ref 0–40)
Albumin: 3.9 g/dL (ref 3.8–4.8)
Alkaline Phosphatase: 47 IU/L — ABNORMAL LOW (ref 49–135)
Bilirubin Total: 0.4 mg/dL (ref 0.0–1.2)
Bilirubin, Direct: 0.15 mg/dL (ref 0.00–0.40)
Total Protein: 6.8 g/dL (ref 6.0–8.5)

## 2024-08-16 ENCOUNTER — Ambulatory Visit: Payer: Self-pay | Admitting: Cardiology

## 2024-08-27 ENCOUNTER — Ambulatory Visit (HOSPITAL_COMMUNITY)
Admission: RE | Admit: 2024-08-27 | Discharge: 2024-08-27 | Disposition: A | Discharge status: Home / Self Care | Source: Ambulatory Visit | Attending: Cardiology | Admitting: Cardiology

## 2024-08-27 DIAGNOSIS — R0602 Shortness of breath: Secondary | ICD-10-CM | POA: Insufficient documentation

## 2024-08-27 DIAGNOSIS — R0609 Other forms of dyspnea: Secondary | ICD-10-CM | POA: Diagnosis present

## 2024-09-01 NOTE — Progress Notes (Unsigned)
 "  Cardiology Office Note    Date:  09/02/2024  ID:  Christina Casey, DOB 02/24/1951, MRN [id number] PCP:  Ransom Other, MD  Cardiologist:  Soyla DELENA Merck, MD  Electrophysiologist:  None   Chief Complaint: Follow up for dyspnea on exertion   History of Present Illness: .   Christina Casey is a 74 y.o. female with visit-pertinent history of hypertension, nonobstructive CAD and dyslipidemia.    Patient first evaluated by Dr. Merck on 05/18/2024 for evaluation of worsening shortness of breath.  She had been referred by her PCP for evaluation of shortness of breath and hypertension management.  Was noted that shortness of breath have been present for the prior 3 years however have been progressively worsening resulting in limiting of her activity.  She denied any chest pain or pressure.  Was noted that her blood pressure was well-controlled on doxazosin, Nebivolol, olmesartan-HCTZ and spironolactone.  Coronary CT and echocardiogram were ordered for further evaluation.  It was noted that patient may need sleep study if unrevealing.   Coronary CT on 05/26/2024 indicated coronary calcium  score of 44.2, 60th 5th percentile for age, sex and race matched control, mild atherosclerosis 25 to 49% in the mid LCx, reviewed by Dr. Merck, noted that the OM was torturous and had small caliber but was patent with no significant stenosis with nonobstructive CAD.   Echocardiogram on 06/24/2024 on review by Dr.  Merck indicated LVEF 65 to 70%, no RWMA, diastolic parameters were normal, RV systolic function and size normal, normal PASP, LA was mildly dilated, RA mildly dilated, trivial mitral valve regurgitation with no evidence of stenosis, aortic valve regurgitation is not visualized, no stenosis present, aorta normal size for age/height and weight.  Patient was last seen in clinic on 06/25/24.  She had remained stable from a cardiac standpoint, she did continue to note dyspnea on exertion, felt she had been  unable to walk as far as she used to 2 years prior as a result of increased shortness of breath.  She denied any chest pain, lower extremity edema, orthopnea or PND.  Echocardiogram with bubble study was ordered for further evaluation, this showed no evidence of PFO.  Today she presents for follow-up.  She reports that she has been doing well, continues to note dyspnea on exertion that is stable and unchanged, she feels this is related to her weight.  She denies any chest pain, lower extremity edema, orthopnea or PND.  She denies any palpitations, presyncope or syncope.  She reports that she is planning to increase her exercise and work on her diet in an effort to lose weight, would be appreciative of referral to healthy weight and wellness.  Discussed completing a sleep study, patient declined at this time. ROS: .   Today she denies chest pain, lower extremity edema, fatigue, palpitations, melena, hematuria, hemoptysis, diaphoresis, weakness, presyncope, syncope, orthopnea, and PND.  All other systems are reviewed and otherwise negative. Studies Reviewed: SABRA   EKG:  EKG is not ordered today.  CV Studies: Cardiac studies reviewed are outlined and summarized above. Otherwise please see EMR for full report. Cardiac Studies & Procedures   ______________________________________________________________________________________________     ECHOCARDIOGRAM  ECHOCARDIOGRAM LIMITED BUBBLE STUDY 08/27/2024  Narrative ECHOCARDIOGRAM LIMITED REPORT    Patient Name:   Christina Casey Date of Exam: 08/27/2024 Medical Rec #:  [id number]       Height:       66.7 in Accession #:    [id number]  Weight:       202.2 lb Date of Birth:  December 02, 1950      BSA:          2.026 m Patient Age:    73 years        BP:           110/58 mmHg Patient Gender: F               HR:           67 bpm. Exam Location:  [address]  Procedure: Limited Echo and Saline Contrast Bubble Study (Both Spectral and Color Flow Doppler  were utilized during procedure).  Indications:    SOB (shortness of breath) R06.09  History:        Patient has prior history of Echocardiogram examinations, most recent 06/24/2024. Risk Factors:Hypertension.  Sonographer:    Augustin Seals RDCS Referring Phys: [id number] Shanaye Rief D Baillie Mohammad  IMPRESSIONS   1. Agitated saline contrast bubble study was negative, with no evidence of any interatrial shunt.  FINDINGS Left Ventricle:  IAS/Shunts: Agitated saline contrast was given intravenously to evaluate for intracardiac shunting. Agitated saline contrast bubble study was negative, with no evidence of any interatrial shunt.  Joelle Cedars Tonleu Electronically signed by Joelle Cedars Ny Signature Date/Time: 08/27/2024/9:59:46 AM    Final      CT SCANS  CT CORONARY MORPH W/CTA COR W/SCORE 05/26/2024  Addendum 06/03/2024  4:19 PM ADDENDUM REPORT: 06/03/2024 16:17  EXAM: OVER-READ INTERPRETATION  CT CHEST  The following report is an over-read performed by radiologist Dr. Andrea Gasman of [facility] on 06/03/2024. This over-read does not include interpretation of cardiac or coronary anatomy or pathology. The coronary CTA interpretation by the cardiologist is attached.  COMPARISON:  None.  FINDINGS: Vascular: Minor descending aortic atherosclerosis. The included aorta is normal in caliber.  Mediastinum/nodes: No adenopathy or mass.  Small hiatal hernia.  Lungs: Scattered areas of bandlike atelectasis or scarring in both lungs. No pulmonary nodule. No pleural fluid. The included airways are patent.  Upper abdomen: No acute findings. Small posterior gastric diverticulum.  Musculoskeletal: There are no acute or suspicious osseous abnormalities.  IMPRESSION: 1. No acute extracardiac findings. 2. Small hiatal hernia. 3. Aortic Atherosclerosis (ICD10-I70.0).   Electronically Signed By: Andrea Gasman M.D. On: 06/03/2024  16:17  Narrative CLINICAL DATA:  Chest pain  EXAM: Cardiac/Coronary CTA  TECHNIQUE: A non-contrast, gated CT scan was obtained with axial slices of 3 mm through the heart for calcium  scoring. Calcium  scoring was performed using the Agatston method. A 120 kV prospective, gated, contrast cardiac scan was obtained. Gantry rotation speed was 250 msecs and collimation was 0.6 mm. Two sublingual nitroglycerin  tablets (0.8 mg) were given. The 3D data set was reconstructed in 5% intervals of the 35-75% of the R-R cycle. Diastolic phases were analyzed on a dedicated workstation using MPR, MIP, and VRT modes. The patient received 95 cc of contrast.  FINDINGS: Image quality: Excellent.  Noise artifact is: Limited.  Coronary Arteries:  Normal coronary origin.  Right dominance.  Left main: The left main is a large caliber vessel with a normal take off from the left coronary cusp that bifurcates to form a left anterior descending artery and a left circumflex artery. There is minimal calcified plaque in the distal LM with associated stenosis of <25%.  Left anterior descending artery: The LAD gives off 2 patent diagonal branches. There is minimal calcified plaque in the ostial LAD with associated stenosis of <25%.  There is calcified plaque in the ostial SP2 but vessel caliber to small to quantitate stenosis.  Left circumflex artery: The LCX is non-dominant and gives off 2 patent obtuse marginal branches. There is mild calcified plaque in the mid LCx with associated stenosis of 25-49%. OM1 is poorly visualized.  Right coronary artery: The RCA is dominant with normal take off from the right coronary cusp. There is minimal calcified plaque in the proximal RCA with associated stenosis of < 25%. The RCA terminates as a PDA and right posterolateral branch without evidence of plaque or stenosis.  Right Atrium: Right atrial size is within normal limits.  Right Ventricle: The right  ventricular cavity is within normal limits.  Left Atrium: Left atrial size is normal in size with no left atrial appendage filling defect.  Left Ventricle: The ventricular cavity size is within normal limits.  Pulmonary arteries: Normal in size.  Pulmonary veins: Normal pulmonary venous drainage.  Pericardium: Normal thickness without significant effusion or calcium  present.  Cardiac valves: The aortic valve is trileaflet without significant calcification. The mitral valve is normal without significant calcification.  Aorta: Normal caliber without significant disease.  Extra-cardiac findings: See attached radiology report for non-cardiac structures.  IMPRESSION: 1. Coronary calcium  score of 44.2. This was 65th percentile for age-, sex, and race-matched controls.  2. Normal coronary origin with right dominance.  3. Mild atherosclerosis: 25-49% mid LCx.  OM1 is poorly visualized.  4. Consider non atherosclerotic causes of chest pain.  5. Consider non invasive stress testing if clinical suspicion for ischemia is high given inability to visualize OM1.  RECOMMENDATIONS: 1. CAD-RADS 0: No evidence of CAD (0%). Consider non-atherosclerotic causes of chest pain.  2. CAD-RADS 1: Minimal non-obstructive CAD (0-24%). Consider non-atherosclerotic causes of chest pain. Consider preventive therapy and risk factor modification.  3. CAD-RADS 2: Mild non-obstructive CAD (25-49%). Consider non-atherosclerotic causes of chest pain. Consider preventive therapy and risk factor modification.  4. CAD-RADS 3: Moderate stenosis. Consider symptom-guided anti-ischemic pharmacotherapy as well as risk factor modification per guideline directed care. Additional analysis with CT FFR will be submitted.  5. CAD-RADS 4: Severe stenosis. (70-99% or > 50% left main). Cardiac catheterization or CT FFR is recommended. Consider symptom-guided anti-ischemic pharmacotherapy as well as risk factor  modification per guideline directed care. Invasive coronary angiography recommended with revascularization per published guideline statements.  6. CAD-RADS 5: Total coronary occlusion (100%). Consider cardiac catheterization or viability assessment. Consider symptom-guided anti-ischemic pharmacotherapy as well as risk factor modification per guideline directed care.  7. CAD-RADS N: Non-diagnostic study. Obstructive CAD can't be excluded. Alternative evaluation is recommended.  Wilbert Bihari, MD  Electronically Signed: By: Wilbert Bihari M.D. On: 05/26/2024 13:16     ______________________________________________________________________________________________       Current Reported Medications:.    Active Medications[1]  Physical Exam:    VS:  BP 134/64   Pulse 68   Ht 5' 0.75 (1.543 m)   Wt 204 lb 12.8 oz (92.9 kg)   SpO2 95%   BMI 39.02 kg/m    Wt Readings from Last 3 Encounters:  09/02/24 204 lb 12.8 oz (92.9 kg)  06/25/24 202 lb 3.2 oz (91.7 kg)  05/18/24 206 lb (93.4 kg)    GEN: Well nourished, well developed in no acute distress NECK: No JVD; No carotid bruits CARDIAC: RRR, no murmurs, rubs, gallops RESPIRATORY:  Clear to auscultation without rales, wheezing or rhonchi  ABDOMEN: Soft, non-tender, non-distended EXTREMITIES:  No edema; No acute deformity  Asessement and Plan:.    CAD: Coronary CT on 05/26/2024 indicated coronary calcium  score of 44.2, 60th 5th percentile for age, sex and race matched control, mild atherosclerosis 25 to 49% in the mid LCx, reviewed by Dr. Loni, noted that the OM was torturous and had small caliber but was patent with no significant stenosis with nonobstructive CAD.  Today she denies chest pain, continues to note some mild dyspnea on exertion, she reports this is unchanged, she feels this is related to her weight given reassuring cardiac workup. Heart healthy diet and regular cardiovascular exercise encouraged.  Patient  reports that she would be appreciative of referral to healthy weight and wellness. Reviewed ED precautions.  Continue doxazosin 4 mg nightly, Nebivolol 10 mg daily, Crestor  10 mg daily and spironolactone 25 mg daily.  Dyspnea on exertion:Coronary CTA reassuring as noted above, echocardiogram with normal biventricular function, no PFO noted on bubble study.  Discussed sleep study, patient politely declined at this time.  Will refer to healthy weight and wellness per patient request.  HTN: Blood pressure today 134/64, patient reports that she took her medications just prior to appointment.  She will continue monitor her blood pressure at home and follow-up with her PCP if consistently elevated above 130/80.  Hyperlipidemia: Last lipid profile in 08/13/2024 indicated total cholesterol 113, HDL 48, triglycerides 97 and LDL 47.  Continue Crestor  10 mg daily.   Disposition: Patient prefers to follow-up as needed.  Signed, Dekota Kirlin D Morgana Rowley, NP       [1]  Current Meds  Medication Sig   Cholecalciferol (VITAMIN D PO) Take 2,000 Units by mouth.   doxazosin (CARDURA) 4 MG tablet 1 tablet Orally at bedtime   IRON PO Take 1 tablet by mouth daily.    metoprolol  tartrate (LOPRESSOR ) 25 MG tablet Take 1 tablet (25 mg total) by mouth once for 1 dose. Take 90-120 minutes prior to scan. Hold for SBP less than 110.   nebivolol (BYSTOLIC) 10 MG tablet Take 10 mg by mouth daily.   Olmesartan Medoxomil (BENICAR PO) Take 1 tablet by mouth daily.    rosuvastatin  (CRESTOR ) 10 MG tablet Take 1 tablet (10 mg total) by mouth daily.   spironolactone (ALDACTONE) 25 MG tablet 1 tablet Orally Once a day; Duration: 90 days   "

## 2024-09-02 ENCOUNTER — Encounter: Payer: Self-pay | Admitting: Cardiology

## 2024-09-02 ENCOUNTER — Encounter (INDEPENDENT_AMBULATORY_CARE_PROVIDER_SITE_OTHER): Payer: Self-pay

## 2024-09-02 ENCOUNTER — Ambulatory Visit: Attending: Cardiology | Admitting: Cardiology

## 2024-09-02 VITALS — BP 134/64 | HR 68 | Ht 60.75 in | Wt 204.8 lb

## 2024-09-02 DIAGNOSIS — Z79899 Other long term (current) drug therapy: Secondary | ICD-10-CM | POA: Diagnosis not present

## 2024-09-02 DIAGNOSIS — R0602 Shortness of breath: Secondary | ICD-10-CM | POA: Insufficient documentation

## 2024-09-02 DIAGNOSIS — E782 Mixed hyperlipidemia: Secondary | ICD-10-CM | POA: Diagnosis present

## 2024-09-02 DIAGNOSIS — I2510 Atherosclerotic heart disease of native coronary artery without angina pectoris: Secondary | ICD-10-CM | POA: Diagnosis not present

## 2024-09-02 DIAGNOSIS — E669 Obesity, unspecified: Secondary | ICD-10-CM | POA: Diagnosis not present

## 2024-09-02 DIAGNOSIS — R0609 Other forms of dyspnea: Secondary | ICD-10-CM | POA: Diagnosis not present

## 2024-09-02 NOTE — Patient Instructions (Signed)
 Medication Instructions:  Your physician recommends that you continue on your current medications as directed. Please refer to the Current Medication list given to you today.  *If you need a refill on your cardiac medications before your next appointment, please call your pharmacy*  Lab Work: NONE If you have labs (blood work) drawn today and your tests are completely normal, you will receive your results only by: MyChart Message (if you have MyChart) OR A paper copy in the mail If you have any lab test that is abnormal or we need to change your treatment, we will call you to review the results.  Testing/Procedures: NONE  Follow-Up: At [facility], you and your health needs are our priority.  As part of our continuing mission to provide you with exceptional heart care, our providers are all part of one team.  This team includes your primary Cardiologist (physician) and Advanced Practice Providers or APPs (Physician Assistants and Nurse Practitioners) who all work together to provide you with the care you need, when you need it.  Your next appointment:    As Needed
